# Patient Record
Sex: Female | Born: 1955
Health system: Southern US, Community
[De-identification: ages and names within clinical notes are randomized; demographics above are authoritative.]

## PROBLEM LIST (undated history)

## (undated) DIAGNOSIS — F32A Depression, unspecified: Secondary | ICD-10-CM

## (undated) DIAGNOSIS — G479 Sleep disorder, unspecified: Secondary | ICD-10-CM

## (undated) DIAGNOSIS — E785 Hyperlipidemia, unspecified: Secondary | ICD-10-CM

## (undated) DIAGNOSIS — F988 Other specified behavioral and emotional disorders with onset usually occurring in childhood and adolescence: Secondary | ICD-10-CM

## (undated) DIAGNOSIS — N200 Calculus of kidney: Secondary | ICD-10-CM

## (undated) DIAGNOSIS — E119 Type 2 diabetes mellitus without complications: Secondary | ICD-10-CM

## (undated) DIAGNOSIS — R079 Chest pain, unspecified: Secondary | ICD-10-CM

## (undated) DIAGNOSIS — C50919 Malignant neoplasm of unspecified site of unspecified female breast: Secondary | ICD-10-CM

## (undated) DIAGNOSIS — F329 Major depressive disorder, single episode, unspecified: Secondary | ICD-10-CM

## (undated) HISTORY — DX: Chest pain, unspecified: R07.9

## (undated) HISTORY — DX: Other specified behavioral and emotional disorders with onset usually occurring in childhood and adolescence: F98.8

## (undated) HISTORY — PX: OTHER SURGICAL HISTORY: SHX169

## (undated) HISTORY — DX: Major depressive disorder, single episode, unspecified: F32.9

## (undated) HISTORY — DX: Malignant neoplasm of unspecified site of unspecified female breast: C50.919

## (undated) HISTORY — DX: Sleep disorder, unspecified: G47.9

## (undated) HISTORY — DX: Type 2 diabetes mellitus without complications: E11.9

## (undated) HISTORY — DX: Depression, unspecified: F32.A

## (undated) HISTORY — DX: Calculus of kidney: N20.0

## (undated) HISTORY — DX: Hyperlipidemia, unspecified: E78.5

---
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
## 1997-08-15 HISTORY — PX: MASTECTOMY: SHX3

## 1998-05-20 ENCOUNTER — Emergency Department (HOSPITAL_COMMUNITY): Admission: EM | Admit: 1998-05-20 | Discharge: 1998-05-20 | Payer: Self-pay | Admitting: Emergency Medicine

## 1998-05-20 ENCOUNTER — Encounter: Payer: Self-pay | Admitting: Emergency Medicine

## 2003-06-19 ENCOUNTER — Other Ambulatory Visit: Admission: RE | Admit: 2003-06-19 | Discharge: 2003-06-19 | Payer: Self-pay | Admitting: *Deleted

## 2003-11-05 ENCOUNTER — Encounter: Admission: RE | Admit: 2003-11-05 | Discharge: 2003-11-05 | Payer: Self-pay | Admitting: Oncology

## 2003-12-15 ENCOUNTER — Ambulatory Visit (HOSPITAL_COMMUNITY): Admission: RE | Admit: 2003-12-15 | Discharge: 2003-12-15 | Payer: Self-pay | Admitting: Oncology

## 2004-06-14 ENCOUNTER — Encounter: Admission: RE | Admit: 2004-06-14 | Discharge: 2004-06-14 | Payer: Self-pay | Admitting: Oncology

## 2004-12-02 ENCOUNTER — Other Ambulatory Visit: Admission: RE | Admit: 2004-12-02 | Discharge: 2004-12-02 | Payer: Self-pay | Admitting: *Deleted

## 2005-01-24 ENCOUNTER — Ambulatory Visit: Payer: Self-pay | Admitting: Oncology

## 2005-04-19 ENCOUNTER — Encounter: Admission: RE | Admit: 2005-04-19 | Discharge: 2005-04-19 | Payer: Self-pay | Admitting: Oncology

## 2005-06-23 ENCOUNTER — Encounter: Payer: Self-pay | Admitting: *Deleted

## 2006-02-14 ENCOUNTER — Ambulatory Visit: Payer: Self-pay | Admitting: Oncology

## 2006-02-22 ENCOUNTER — Ambulatory Visit (HOSPITAL_COMMUNITY): Admission: RE | Admit: 2006-02-22 | Discharge: 2006-02-22 | Payer: Self-pay | Admitting: Oncology

## 2006-02-22 LAB — CBC WITH DIFFERENTIAL/PLATELET
BASO%: 0.6 % (ref 0.0–2.0)
EOS%: 1 % (ref 0.0–7.0)
HCT: 42.6 % (ref 34.8–46.6)
MCH: 30 pg (ref 26.0–34.0)
MCHC: 34.8 g/dL (ref 32.0–36.0)
MCV: 86.3 fL (ref 81.0–101.0)
NEUT#: 5.8 10*3/uL (ref 1.5–6.5)
RBC: 4.94 10*6/uL (ref 3.70–5.32)
RDW: 12.5 % (ref 11.3–14.5)
lymph#: 2.4 10*3/uL (ref 0.9–3.3)

## 2006-02-22 LAB — COMPREHENSIVE METABOLIC PANEL
AST: 25 U/L (ref 0–37)
Alkaline Phosphatase: 119 U/L — ABNORMAL HIGH (ref 39–117)
BUN: 13 mg/dL (ref 6–23)
CO2: 24 mEq/L (ref 19–32)
Chloride: 102 mEq/L (ref 96–112)
Creatinine, Ser: 0.6 mg/dL (ref 0.40–1.20)
Glucose, Bld: 85 mg/dL (ref 70–99)
Potassium: 4.4 mEq/L (ref 3.5–5.3)
Sodium: 138 mEq/L (ref 135–145)
Total Bilirubin: 0.2 mg/dL — ABNORMAL LOW (ref 0.3–1.2)

## 2006-05-25 ENCOUNTER — Encounter: Admission: RE | Admit: 2006-05-25 | Discharge: 2006-05-25 | Payer: Self-pay | Admitting: Oncology

## 2006-11-29 ENCOUNTER — Other Ambulatory Visit: Admission: RE | Admit: 2006-11-29 | Discharge: 2006-11-29 | Payer: Self-pay | Admitting: Obstetrics & Gynecology

## 2007-01-24 ENCOUNTER — Encounter (HOSPITAL_COMMUNITY): Admission: RE | Admit: 2007-01-24 | Discharge: 2007-01-25 | Payer: Self-pay | Admitting: Endocrinology

## 2007-02-21 ENCOUNTER — Ambulatory Visit: Payer: Self-pay | Admitting: Oncology

## 2007-02-23 LAB — CBC WITH DIFFERENTIAL/PLATELET
BASO%: 0.6 % (ref 0.0–2.0)
Basophils Absolute: 0 10*3/uL (ref 0.0–0.1)
EOS%: 1.5 % (ref 0.0–7.0)
Eosinophils Absolute: 0.1 10*3/uL (ref 0.0–0.5)
HCT: 44.9 % (ref 34.8–46.6)
LYMPH%: 40.6 % (ref 14.0–48.0)
MCHC: 35.3 g/dL (ref 32.0–36.0)
MONO%: 6.1 % (ref 0.0–13.0)
NEUT%: 51.2 % (ref 39.6–76.8)
RDW: 12.3 % (ref 11.3–14.5)

## 2007-02-23 LAB — COMPREHENSIVE METABOLIC PANEL
ALT: 20 U/L (ref 0–35)
Alkaline Phosphatase: 139 U/L — ABNORMAL HIGH (ref 39–117)
BUN: 10 mg/dL (ref 6–23)
CO2: 25 mEq/L (ref 19–32)
Creatinine, Ser: 0.74 mg/dL (ref 0.40–1.20)
Glucose, Bld: 83 mg/dL (ref 70–99)
Potassium: 4.6 mEq/L (ref 3.5–5.3)

## 2007-05-28 ENCOUNTER — Encounter: Admission: RE | Admit: 2007-05-28 | Discharge: 2007-05-28 | Payer: Self-pay | Admitting: Oncology

## 2008-02-27 ENCOUNTER — Ambulatory Visit: Payer: Self-pay | Admitting: Oncology

## 2008-09-08 ENCOUNTER — Encounter: Admission: RE | Admit: 2008-09-08 | Discharge: 2008-09-08 | Payer: Self-pay | Admitting: Oncology

## 2008-12-05 ENCOUNTER — Encounter: Admission: RE | Admit: 2008-12-05 | Discharge: 2008-12-05 | Payer: Self-pay | Admitting: Family Medicine

## 2008-12-08 ENCOUNTER — Encounter: Payer: Self-pay | Admitting: *Deleted

## 2009-09-28 ENCOUNTER — Encounter: Admission: RE | Admit: 2009-09-28 | Discharge: 2009-09-28 | Payer: Self-pay | Admitting: Oncology

## 2009-12-15 ENCOUNTER — Encounter (INDEPENDENT_AMBULATORY_CARE_PROVIDER_SITE_OTHER): Payer: Self-pay | Admitting: *Deleted

## 2009-12-16 ENCOUNTER — Encounter (INDEPENDENT_AMBULATORY_CARE_PROVIDER_SITE_OTHER): Payer: Self-pay | Admitting: *Deleted

## 2009-12-18 ENCOUNTER — Ambulatory Visit: Payer: Self-pay | Admitting: Internal Medicine

## 2009-12-31 ENCOUNTER — Telehealth: Payer: Self-pay | Admitting: Internal Medicine

## 2010-01-01 ENCOUNTER — Ambulatory Visit: Payer: Self-pay | Admitting: Internal Medicine

## 2010-01-04 ENCOUNTER — Encounter: Payer: Self-pay | Admitting: Internal Medicine

## 2010-09-05 ENCOUNTER — Encounter: Payer: Self-pay | Admitting: Family Medicine

## 2010-09-05 ENCOUNTER — Encounter: Payer: Self-pay | Admitting: Oncology

## 2010-09-16 NOTE — Letter (Signed)
Summary: Patient Notice- Polyp Results   Gastroenterology  [address] [place], [place] [address]   Phone: [phone]  Fax: [phone]        Jan 04, 2010 MRN: [id number]    [facility] [address] [place], [place]  [address]    Dear Ms. Gasper Lloyd,  I am pleased to inform you that the colon polyp(s) removed during your recent colonoscopy was (were) found to be benign (no cancer detected) upon pathologic examination.The polyp was tubovillous ( premalignant).  I recommend you have a repeat colonoscopy examination in 3_ years to look for recurrent polyps, as having colon polyps increases your risk for having recurrent polyps or even colon cancer in the future.  Should you develop new or worsening symptoms of abdominal pain, bowel habit changes or bleeding from the rectum or bowels, please schedule an evaluation with either your primary care physician or with me.  Additional information/recommendations:  _x_ No further action with gastroenterology is needed at this time. Please      follow-up with your primary care physician for your other healthcare      needs.  __ Please call [phone] to schedule a return visit to review your      situation.  __ Please keep your follow-up visit as already scheduled.  __ Continue treatment plan as outlined the day of your exam.  Please call us if you are having persistent problems or have questions about your condition that have not been fully answered at this time.  Sincerely,  Hart Carwin MD  This letter has been electronically signed by your physician.  Appended Document: Patient Notice- Polyp Results letter mailed

## 2010-09-16 NOTE — Letter (Signed)
Summary: Previsit letter  [facility]astroenterology  [address] [place], [place] [address]   Phone: [phone]  Fax: [phone]       12/15/2009 MRN: [id number]  [facility] [address] [place], [place]  [address]  Dear Ms. Lacey Perkins,  Welcome to the Gastroenterology Division at [facility].    You are scheduled to see a nurse for your pre-procedure visit on 12/18/2009 at 8:30AM on the 3rd floor at [facility], [address]  We ask that you try to arrive at our office 15 minutes prior to your appointment time to allow for check-in.  Your nurse visit will consist of discussing your medical and surgical history, your immediate family medical history, and your medications.    Please bring a complete list of all your medications or, if you prefer, bring the medication bottles and we will list them.  We will need to be aware of both prescribed and over the counter drugs.  We will need to know exact dosage information as well.  If you are on blood thinners (Coumadin, Plavix, Aggrenox, Ticlid, etc.) please call our office today/prior to your appointment, as we need to consult with your physician about holding your medication.   Please be prepared to read and sign documents such as consent forms, a financial agreement, and acknowledgement forms.  If necessary, and with your consent, a friend or relative is welcome to sit-in on the nurse visit with you.  Please bring your insurance card so that we may make a copy of it.  If your insurance requires a referral to see a specialist, please bring your referral form from your primary care physician.  No co-pay is required for this nurse visit.     If you cannot keep your appointment, please call [phone] to cancel or reschedule prior to your appointment date.  This allows [place] the opportunity to schedule an appointment for another patient in need of care.    Thank you for choosing [facility] for  your medical needs.  We appreciate the opportunity to care for you.  Please visit [place] at our website  to learn more about our practice.                     Sincerely.                                                                                                                   The Gastroenterology Division

## 2010-09-16 NOTE — Progress Notes (Signed)
Summary: Prep question  Phone Note Call from Patient Call back at Home Phone [phone]   Call For: Dr Juanda Chance Reason for Call: Talk to Nurse Summary of Call: Confused about what time she should start to drink her prep. Doesnt think its 5pm Initial call taken by: Leanor Kail [facility],  Dec 31, 2009 12:03 PM  Follow-up for Phone Call        Returned pts phone call and discussed starting time of prep.  Advised pt that she could start prep an hour earlier if she thought that would be easier. Explained that she needed to move everything on her instructions up an hour.   Pt. verbalized understanding.   Follow-up by: Jennye Boroughs RN,  Dec 31, 2009 1:19 PM

## 2010-09-16 NOTE — Letter (Signed)
Summary: [facility] Instructions  [facility]  [address] [place], [place] [address]   Phone: [phone]  Fax: [phone]       Lacey Perkins    08-10-1956    MRN: [id number]       Procedure Day /Date:  Friday 01/01/2010     Arrival Time:  7:30 am     Procedure Time: 8:00 am     Location of Procedure:                    _x _  [facility] (4th Floor)    PREPARATION FOR COLONOSCOPY WITH MIRALAX  Starting 5 days prior to your procedure Sunday 5/15 do not eat nuts, seeds, popcorn, corn, beans, peas,  salads, or any raw vegetables.  Do not take any fiber supplements (e.g. Metamucil, Citrucel, and Benefiber). ____________________________________________________________________________________________________   THE DAY BEFORE YOUR PROCEDURE         DATE: Thursday 5/19  1   Drink clear liquids the entire day-NO SOLID FOOD  2   Do not drink anything colored red or purple.  Avoid juices with pulp.  No orange juice.  3   Drink at least 64 oz. (8 glasses) of fluid/clear liquids during the day to prevent dehydration and help the prep work efficiently.  CLEAR LIQUIDS INCLUDE: Water Jello Ice Popsicles Tea (sugar ok, no milk/cream) Powdered fruit flavored drinks Coffee (sugar ok, no milk/cream) Gatorade Juice: apple, white grape, white cranberry  Lemonade Clear bullion, consomm, broth Carbonated beverages (any kind) Strained chicken noodle soup Hard Candy  4   Mix the entire bottle of Miralax with 64 oz. of Gatorade/Powerade in the morning and put in the refrigerator to chill.  5   At 3:00 pm take 2 Dulcolax/Bisacodyl tablets.  6   At 4:30 pm take one Reglan/Metoclopramide tablet.  7  Starting at 5:00 pm drink one 8 oz glass of the Miralax mixture every 15-20 minutes until you have finished drinking the entire 64 oz.  You should finish drinking prep around 7:30 or 8:00 pm.  8   If you are nauseated, you may take the 2nd  Reglan/Metoclopramide tablet at 6:30 pm.        9    At 8:00 pm take 2 more DULCOLAX/Bisacodyl tablets.     THE DAY OF YOUR PROCEDURE      DATE: Friday 5/20  You may drink clear liquids until 6:00 am   (2 HOURS BEFORE PROCEDURE).   MEDICATION INSTRUCTIONS  Unless otherwise instructed, you should take regular prescription medications with a small sip of water as early as possible the morning of your procedure.           OTHER INSTRUCTIONS  You will need a responsible adult at least 53 years of age to accompany you and drive you home.   This person must remain in the waiting room during your procedure.  Wear loose fitting clothing that is easily removed.  Leave jewelry and other valuables at home.  However, you may wish to bring a book to read or an iPod/MP3 player to listen to music as you wait for your procedure to start.  Remove all body piercing jewelry and leave at home.  Total time from sign-in until discharge is approximately 2-3 hours.  You should go home directly after your procedure and rest.  You can resume normal activities the day after your procedure.  The day of your procedure you should not:   Drive  Make legal decisions   Operate machinery   Drink alcohol   Return to work  You will receive specific instructions about eating, activities and medications before you leave.   The above instructions have been reviewed and explained to me by   Ezra Sites RN  Dec 18, 2009 8:38 AM    I fully understand and can verbalize these instructions _____________________________ Date _______

## 2010-09-16 NOTE — Procedures (Signed)
Summary: Colonoscopy  Patient: Lacey Perkins Note: All result statuses are Final unless otherwise noted.  Tests: (1) Colonoscopy (COL)   COL Colonoscopy           DONE     [facility]     [address]     [place], [place]  [address]           COLONOSCOPY PROCEDURE REPORT           PATIENT:  Lacey, Perkins  MR#:  [id number]     BIRTHDATE:  08-25-1955, 54 yrs. old  GENDER:  female     ENDOSCOPIST:  Hedwig Morton. Juanda Chance, MD     REF. BY:  Kathee Delton, PA     PROCEDURE DATE:  01/01/2010     PROCEDURE:  Colonoscopy [address]     ASA CLASS:  Class I     INDICATIONS:  Routine Risk Screening     MEDICATIONS:   Versed 10 mg, Fentanyl 100 mcg           DESCRIPTION OF PROCEDURE:   After the risks benefits and     alternatives of the procedure were thoroughly explained, informed     consent was obtained.  Digital rectal exam was performed and     revealed no rectal masses.   The LB CF-[id number] endoscope     was introduced through the anus and advanced to the cecum, which     was identified by both the appendix and ileocecal valve, without     limitations.  The quality of the prep was excellent, using     MiraLax.  The instrument was then slowly withdrawn as the colon     was fully examined.     <<PROCEDUREIMAGES>>           FINDINGS:  A sessile polyp was found in the cecum. large 20mm     multilobulated polyp in the cecal pouch removed piecemeal Polyp     was snared, then cauterized with monopolar cautery. Retrieval was     successful (see image1 and image2). snare polyp  This was     otherwise a normal examination of the colon (see image5, image4,     and image3).   Retroflexed views in the rectum revealed no     abnormalities.    The scope was then withdrawn from the patient     and the procedure completed.           COMPLICATIONS:  None     ENDOSCOPIC IMPRESSION:     1) Sessile polyp in the cecum     2) Otherwise normal examination     picemeal removal  of a lsrge cecal polyp     RECOMMENDATIONS:     1) Await pathology results     2) High fiber diet.     REPEAT EXAM:  In 3 year(s) for.           ______________________________     Hedwig Morton. Juanda Chance, MD           CC:           n.     eSIGNED:   Hedwig Morton. Abbegale Stehle at 01/01/2010 08:31 AM           Weldon Inches, [id number]  Note: An exclamation mark (!) indicates a result that was not dispersed into the flowsheet. Document Creation Date: 01/01/2010 1:52 PM _______________________________________________________________________  (1) Order result status: Final Collection or observation date-time: 01/01/2010 08:25  Requested date-time:  Receipt date-time:  Reported date-time:  Referring Physician:   Ordering Physician: Lina Sar [phone]) Specimen Source:  Source: Launa Grill Order Number: [id number] Lab site:   Appended Document: Colonoscopy     Procedures Next Due Date:    Colonoscopy: 01/2013

## 2010-09-16 NOTE — Miscellaneous (Signed)
Summary: [facility]  Clinical Lists Changes  Medications: Added new medication of MIRALAX   POWD (POLYETHYLENE GLYCOL 3350) As per prep  instructions. - Signed Added new medication of REGLAN 10 MG  TABS (METOCLOPRAMIDE HCL) As per prep instructions. - Signed Added new medication of DULCOLAX 5 MG  TBEC (BISACODYL) Day before procedure take 2 at 3pm and 2 at 8pm. - Signed Rx of MIRALAX   POWD (POLYETHYLENE GLYCOL 3350) As per prep  instructions.;  #255gm x 0;  Signed;  Entered by: Ezra Sites RN;  Authorized by: Hart Carwin MD;  Method used: Electronically to [facility]. #[id number]*, [address], [place], [place], [place]  [address], Ph: [phone], Fax: [phone] Rx of REGLAN 10 MG  TABS (METOCLOPRAMIDE HCL) As per prep instructions.;  #2 x 0;  Signed;  Entered by: Ezra Sites RN;  Authorized by: Hart Carwin MD;  Method used: Electronically to [facility]. #[id number]*, [address], [place], [place], [place]  [address], Ph: [phone], Fax: [phone] Rx of DULCOLAX 5 MG  TBEC (BISACODYL) Day before procedure take 2 at 3pm and 2 at 8pm.;  #4 x 0;  Signed;  Entered by: Ezra Sites RN;  Authorized by: Hart Carwin MD;  Method used: Electronically to [facility]. #[id number]*, [address] [place], [place], [place]  [address], Ph: [phone], Fax: [phone] Allergies: Added new allergy or adverse reaction of SULFA Added new allergy or adverse reaction of PCN Added new allergy or adverse reaction of MORPHINE Observations: Added new observation of NKA: F (12/18/2009 8:12)    Prescriptions: DULCOLAX 5 MG  TBEC (BISACODYL) Day before procedure take 2 at 3pm and 2 at 8pm.  #4 x 0   Entered by:   Ezra Sites RN   Authorized by:   Hart Carwin MD   Signed by:   Ezra Sites RN on 12/18/2009   Method used:   Electronically to        [facility]. [phone]* (retail)       [phone]       [place], [place]  [address]       Ph: [phone]       Fax: [phone]   RxID:   [id number] REGLAN 10 MG  TABS (METOCLOPRAMIDE HCL) As per prep instructions.  #2 x 0   Entered by:   Ezra Sites RN   Authorized by:   Hart Carwin MD   Signed by:   Ezra Sites RN on 12/18/2009   Method used:   Electronically to        [facility]. [phone]* (retail)       [phone] [address].       [place], [place]  [address]       Ph: [phone]       Fax: [phone]   RxID:   [id number] MIRALAX   POWD (POLYETHYLENE GLYCOL 3350) As per prep  instructions.  #255gm x 0   Entered by:   Ezra Sites RN   Authorized by:   Hart Carwin MD   Signed by:   Ezra Sites RN on 12/18/2009   Method used:   Electronically to        [facility]. [phone]* (retail)       [phone] [address].  [place], [place]  [address]       Ph: [phone]       Fax: [phone]   RxID:   [id number]

## 2012-01-24 ENCOUNTER — Other Ambulatory Visit: Payer: Self-pay | Admitting: Family Medicine

## 2012-01-24 DIAGNOSIS — Z1231 Encounter for screening mammogram for malignant neoplasm of breast: Secondary | ICD-10-CM

## 2012-01-25 ENCOUNTER — Ambulatory Visit
Admission: RE | Admit: 2012-01-25 | Discharge: 2012-01-25 | Disposition: A | Discharge status: Home / Self Care | Payer: Managed Care, Other (non HMO) | Source: Ambulatory Visit | Attending: Family Medicine | Admitting: Family Medicine

## 2012-01-25 DIAGNOSIS — Z1231 Encounter for screening mammogram for malignant neoplasm of breast: Secondary | ICD-10-CM

## 2012-01-27 ENCOUNTER — Encounter: Payer: Self-pay | Admitting: Cardiovascular Disease

## 2012-01-27 ENCOUNTER — Encounter: Payer: Self-pay | Admitting: *Deleted

## 2012-12-05 ENCOUNTER — Encounter: Payer: Self-pay | Admitting: Internal Medicine

## 2012-12-10 ENCOUNTER — Other Ambulatory Visit (HOSPITAL_COMMUNITY): Payer: Self-pay | Admitting: Internal Medicine

## 2012-12-10 DIAGNOSIS — R079 Chest pain, unspecified: Secondary | ICD-10-CM

## 2012-12-14 ENCOUNTER — Ambulatory Visit (HOSPITAL_COMMUNITY)
Admission: RE | Admit: 2012-12-14 | Discharge: 2012-12-14 | Disposition: A | Discharge status: Home / Self Care | Payer: Managed Care, Other (non HMO) | Source: Ambulatory Visit | Attending: Cardiovascular Disease | Admitting: Cardiovascular Disease

## 2012-12-14 DIAGNOSIS — R079 Chest pain, unspecified: Secondary | ICD-10-CM

## 2012-12-14 MED ORDER — TECHNETIUM TC 99M SESTAMIBI GENERIC - CARDIOLITE
29.6000 | Freq: Once | INTRAVENOUS | Status: AC | PRN
  Administered 2012-12-14: 30 via INTRAVENOUS

## 2012-12-14 MED ORDER — TECHNETIUM TC 99M SESTAMIBI GENERIC - CARDIOLITE
10.1000 | Freq: Once | INTRAVENOUS | Status: AC | PRN
  Administered 2012-12-14: 10.1 via INTRAVENOUS

## 2012-12-14 NOTE — Procedures (Addendum)
[facility] [address] [place] [address] [place] [address] [phone]  Cardiology Nuclear Med Study  Lacey Perkins is a 57 y.o. female     MRN : [id number]     DOB: Apr 08, 1956  Procedure Date: 12/14/2012  Nuclear Med Background Indication for Stress Test:  Evaluation for Ischemia History:  PT DENIES PRIOR CARDIAC AND RESPIRATORY HISTORY. Cardiac Risk Factors: Family History - CAD, Lipids, NIDDM, Overweight and Smoker  Symptoms:  Chest Pain, Dizziness, DOE, Fatigue, Light-Headedness, Palpitations and SOB   Nuclear Pre-Procedure Caffeine/Decaff Intake:  10:00pm NPO After: 8:00am   IV Site: R Antecubital  IV 0.9% NS with Angio Cath:  22g  Chest Size (in):  N/A IV Started by: Emmit Pomfret, RN  Height: 5\' 3"  (1.6 m)  Cup Size: D  BMI:  Body mass index is 28.88 kg/(m^2). Weight:  163 lb (73.936 kg)   Tech Comments:  N/A    Nuclear Med Study 1 or 2 day study: 1 day  Stress Test Type:  Stress  Order Authorizing Provider:  Zoila Shutter, MD   Resting Radionuclide: Technetium 5m Sestamibi  Resting Radionuclide Dose: 10.1 mCi   Stress Radionuclide:  Technetium 64m Sestamibi  Stress Radionuclide Dose: 29.6 mCi           Stress Protocol Rest HR: 96 Stress HR: 151  Rest BP: 140/91 Stress BP: 178/84  Exercise Time (min): 8:00 METS: 10.1   Predicted Max HR: 165 bpm % Max HR: 91.52 bpm Rate Pressure Product: 40102  Dose of Adenosine (mg):  n/a Dose of Lexiscan: n/a mg  Dose of Atropine (mg): n/a Dose of Dobutamine: n/a mcg/kg/min (at max HR)  Stress Test Technologist: Esperanza Sheets, CCT Nuclear Technologist: Gonzella Lex, CNMT   Rest Procedure:  Myocardial perfusion imaging was performed at rest 45 minutes following the intravenous administration of Technetium 29m Sestamibi. Stress Procedure:  The patient performed treadmill exercise using a Bruce  Protocol for 8:00 minutes. The patient stopped due to SOB and denied any  chest pain.  There were no significant ST-T wave changes.  Technetium 52m Sestamibi was injected at peak exercise and myocardial perfusion imaging was performed after a brief delay.  Transient Ischemic Dilatation (Normal <1.22):  0.91 Lung/Heart Ratio (Normal <0.45):  0.38 QGS EDV:  43 ml QGS ESV:  12 ml LV Ejection Fraction: 73%     Rest ECG: NSR - Normal EKG  Stress ECG: No significant change from baseline ECG  QPS Raw Data Images:  Normal; no motion artifact; normal heart/lung ratio. Stress Images:  Normal homogeneous uptake in all areas of the myocardium. Rest Images:  Comparison with the stress images reveals no significant change. Subtraction (SDS):  No evidence of ischemia.  Impression Exercise Capacity:  Good exercise capacity. BP Response:  Hypertensive blood pressure response. Clinical Symptoms:  No significant symptoms noted. ECG Impression:  No significant ST segment change suggestive of ischemia. Comparison with Prior Nuclear Study: No significant change from previous study  Overall Impression:  Normal stress nuclear study.  LV Wall Motion:  NL LV Function; NL Wall Motion   Damarion Mendizabal, MD  12/14/2012 4:30 PM

## 2013-02-08 ENCOUNTER — Other Ambulatory Visit: Payer: Self-pay | Admitting: Family Medicine

## 2013-02-08 DIAGNOSIS — Z1231 Encounter for screening mammogram for malignant neoplasm of breast: Secondary | ICD-10-CM

## 2013-03-07 ENCOUNTER — Ambulatory Visit: Payer: Managed Care, Other (non HMO)

## 2013-03-11 ENCOUNTER — Ambulatory Visit
Admission: RE | Admit: 2013-03-11 | Discharge: 2013-03-11 | Disposition: A | Discharge status: Home / Self Care | Payer: Managed Care, Other (non HMO) | Source: Ambulatory Visit | Attending: Family Medicine | Admitting: Family Medicine

## 2013-03-11 DIAGNOSIS — Z1231 Encounter for screening mammogram for malignant neoplasm of breast: Secondary | ICD-10-CM

## 2013-10-19 ENCOUNTER — Encounter (HOSPITAL_COMMUNITY): Payer: Self-pay | Admitting: Emergency Medicine

## 2013-10-19 ENCOUNTER — Emergency Department (HOSPITAL_COMMUNITY)
Admission: EM | Admit: 2013-10-19 | Discharge: 2013-10-20 | Disposition: A | Discharge status: Home / Self Care | Payer: Managed Care, Other (non HMO) | Attending: Emergency Medicine | Admitting: Emergency Medicine

## 2013-10-19 ENCOUNTER — Emergency Department (HOSPITAL_COMMUNITY): Payer: Managed Care, Other (non HMO)

## 2013-10-19 DIAGNOSIS — F3289 Other specified depressive episodes: Secondary | ICD-10-CM | POA: Insufficient documentation

## 2013-10-19 DIAGNOSIS — R109 Unspecified abdominal pain: Secondary | ICD-10-CM | POA: Insufficient documentation

## 2013-10-19 DIAGNOSIS — R112 Nausea with vomiting, unspecified: Secondary | ICD-10-CM

## 2013-10-19 DIAGNOSIS — F329 Major depressive disorder, single episode, unspecified: Secondary | ICD-10-CM | POA: Insufficient documentation

## 2013-10-19 DIAGNOSIS — F988 Other specified behavioral and emotional disorders with onset usually occurring in childhood and adolescence: Secondary | ICD-10-CM | POA: Insufficient documentation

## 2013-10-19 DIAGNOSIS — Z87442 Personal history of urinary calculi: Secondary | ICD-10-CM | POA: Insufficient documentation

## 2013-10-19 DIAGNOSIS — Z79899 Other long term (current) drug therapy: Secondary | ICD-10-CM | POA: Insufficient documentation

## 2013-10-19 DIAGNOSIS — R339 Retention of urine, unspecified: Secondary | ICD-10-CM | POA: Insufficient documentation

## 2013-10-19 DIAGNOSIS — R42 Dizziness and giddiness: Secondary | ICD-10-CM | POA: Insufficient documentation

## 2013-10-19 DIAGNOSIS — IMO0002 Reserved for concepts with insufficient information to code with codable children: Secondary | ICD-10-CM | POA: Insufficient documentation

## 2013-10-19 DIAGNOSIS — G479 Sleep disorder, unspecified: Secondary | ICD-10-CM | POA: Insufficient documentation

## 2013-10-19 DIAGNOSIS — F17200 Nicotine dependence, unspecified, uncomplicated: Secondary | ICD-10-CM | POA: Insufficient documentation

## 2013-10-19 DIAGNOSIS — Z880 Allergy status to penicillin: Secondary | ICD-10-CM | POA: Insufficient documentation

## 2013-10-19 DIAGNOSIS — Z853 Personal history of malignant neoplasm of breast: Secondary | ICD-10-CM | POA: Insufficient documentation

## 2013-10-19 LAB — COMPREHENSIVE METABOLIC PANEL
ALBUMIN: 4.3 g/dL (ref 3.5–5.2)
ALT: 39 U/L — ABNORMAL HIGH (ref 0–35)
AST: 26 U/L (ref 0–37)
Alkaline Phosphatase: 139 U/L — ABNORMAL HIGH (ref 39–117)
BILIRUBIN TOTAL: 0.4 mg/dL (ref 0.3–1.2)
BUN: 14 mg/dL (ref 6–23)
CALCIUM: 9.9 mg/dL (ref 8.4–10.5)
CHLORIDE: 97 meq/L (ref 96–112)
CO2: 23 mEq/L (ref 19–32)
CREATININE: 0.4 mg/dL — AB (ref 0.50–1.10)
GFR calc Af Amer: >90 mL/min (ref >90)
GFR calc non Af Amer: >90 mL/min (ref >90)
Glucose, Bld: 153 mg/dL — ABNORMAL HIGH (ref 70–99)
Potassium: 4.3 mEq/L (ref 3.7–5.3)
Sodium: 138 mEq/L (ref 137–147)
Total Protein: 7.8 g/dL (ref 6.0–8.3)

## 2013-10-19 LAB — CBC WITH DIFFERENTIAL/PLATELET
BASOS PCT: 0 % (ref 0–1)
Basophils Absolute: 0 10*3/uL (ref 0.0–0.1)
Eosinophils Absolute: 0 10*3/uL (ref 0.0–0.7)
Eosinophils Relative: 0 % (ref 0–5)
HCT: 47.3 % — ABNORMAL HIGH (ref 36.0–46.0)
Hemoglobin: 16.1 g/dL — ABNORMAL HIGH (ref 12.0–15.0)
Lymphocytes Relative: 19 % (ref 12–46)
Lymphs Abs: 2.3 10*3/uL (ref 0.7–4.0)
MCH: 28.6 pg (ref 26.0–34.0)
MCHC: 34 g/dL (ref 30.0–36.0)
MCV: 84 fL (ref 78.0–100.0)
MONO ABS: 0.5 10*3/uL (ref 0.1–1.0)
Monocytes Relative: 4 % (ref 3–12)
Neutro Abs: 9.7 10*3/uL — ABNORMAL HIGH (ref 1.7–7.7)
Neutrophils Relative %: 78 % — ABNORMAL HIGH (ref 43–77)
Platelets: 370 10*3/uL (ref 150–400)
RBC: 5.63 MIL/uL — AB (ref 3.87–5.11)
RDW: 13.1 % (ref 11.5–15.5)
WBC: 12.5 10*3/uL — ABNORMAL HIGH (ref 4.0–10.5)

## 2013-10-19 LAB — LIPASE, BLOOD: Lipase: 16 U/L (ref 11–59)

## 2013-10-19 LAB — URINALYSIS, ROUTINE W REFLEX MICROSCOPIC
Bilirubin Urine: NEGATIVE
Glucose, UA: >1000 mg/dL — AB
Hgb urine dipstick: NEGATIVE
Ketones, ur: >80 mg/dL — AB
Leukocytes, UA: NEGATIVE
Nitrite: NEGATIVE
Protein, ur: NEGATIVE mg/dL
Specific Gravity, Urine: 1.037 — ABNORMAL HIGH (ref 1.005–1.030)
Urobilinogen, UA: 0.2 mg/dL (ref 0.0–1.0)
pH: 5.5 (ref 5.0–8.0)

## 2013-10-19 LAB — URINE MICROSCOPIC-ADD ON

## 2013-10-19 LAB — I-STAT CG4 LACTIC ACID, ED: Lactic Acid, Venous: 0.82 mmol/L (ref 0.5–2.2)

## 2013-10-19 MED ORDER — ONDANSETRON HCL 4 MG/2ML IJ SOLN
4.0000 mg | Freq: Once | INTRAMUSCULAR | Status: AC
  Administered 2013-10-19: 4 mg via INTRAVENOUS
  Filled 2013-10-19: qty 2

## 2013-10-19 MED ORDER — IOHEXOL 300 MG/ML  SOLN
50.0000 mL | Freq: Once | INTRAMUSCULAR | Status: AC | PRN
  Administered 2013-10-19: 50 mL via ORAL

## 2013-10-19 MED ORDER — PROMETHAZINE HCL 25 MG/ML IJ SOLN
12.5000 mg | Freq: Once | INTRAMUSCULAR | Status: AC
  Administered 2013-10-20: 12.5 mg via INTRAVENOUS
  Filled 2013-10-19: qty 1

## 2013-10-19 MED ORDER — IOHEXOL 300 MG/ML  SOLN
100.0000 mL | Freq: Once | INTRAMUSCULAR | Status: AC | PRN
  Administered 2013-10-19: 100 mL via INTRAVENOUS

## 2013-10-19 MED ORDER — SODIUM CHLORIDE 0.9 % IV BOLUS (SEPSIS)
1000.0000 mL | Freq: Once | INTRAVENOUS | Status: AC
  Administered 2013-10-19: 1000 mL via INTRAVENOUS

## 2013-10-19 MED ORDER — KETOROLAC TROMETHAMINE 15 MG/ML IJ SOLN
30.0000 mg | Freq: Once | INTRAMUSCULAR | Status: AC
  Administered 2013-10-19: 30 mg via INTRAVENOUS
  Filled 2013-10-19: qty 2

## 2013-10-19 MED ORDER — KETOROLAC TROMETHAMINE 15 MG/ML IJ SOLN
30.0000 mg | Freq: Once | INTRAMUSCULAR | Status: AC
  Administered 2013-10-20: 30 mg via INTRAVENOUS
  Filled 2013-10-19: qty 2

## 2013-10-19 MED ORDER — ONDANSETRON HCL 4 MG/2ML IJ SOLN: 4.0000 mg | Freq: Once | INTRAMUSCULAR | Status: DC

## 2013-10-19 NOTE — ED Notes (Signed)
Pt sent from [facility], for N/V, dizziness and urinary retention. Pt states that she has not urinated since sometime this am. Pt also c/o N/V, inability to keep fluids/food down. 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 Pt is A&O and in NAD

## 2013-10-19 NOTE — ED Provider Notes (Signed)
CSN: [id number]     Arrival date & time 10/19/13  1832 History   First MD Initiated Contact with Patient 10/19/13 1843     Chief Complaint  Patient presents with  . Nausea  . Emesis  . Dizziness  . Urinary Retention     (Consider location/radiation/quality/duration/timing/severity/associated sxs/prior Treatment) HPI Patient presents to the emergency department with nausea, vomiting, last 12 hours.  Patient, states, that she's also had some mild dizziness, and decreased urination.  Patient, states, that she's not had any chest pain, shortness of breath, weakness, headache, blurred vision, back pain, rash, fever, or syncope.  The patient, states, that she is unable to keep any fluids down.  Patient, states, that her blood sugars have been running around normal levels even though she is vomiting.  Patient did not take any medications for her symptoms prior to route   Past Medical History  Diagnosis Date  . Chest pain   . ADD (attention deficit disorder)   . Sleep disturbance   . Breast cancer   . Kidney stone   . Depression    Past Surgical History  Procedure Laterality Date  . Mastectomy    . Tonsillectomy     Family History  Problem Relation Age of Onset  . Hypertension Mother   . Diabetes Mother   . Hypertension Father    History  Substance Use Topics  . Smoking status: Current Every Day Smoker -- 1.00 packs/day    Types: Cigarettes  . Smokeless tobacco: Not on file  . Alcohol Use: Yes     Comment: socially   OB History   Grav Para Term Preterm Abortions TAB SAB Ect Mult Living                 Review of Systems  All other systems negative except as documented in the HPI. All pertinent positives and negatives as reviewed in the HPI.  Allergies  Morphine; Penicillins; and Sulfonamide derivatives  Home Medications   Current Outpatient Rx  Name  Route  Sig  Dispense  Refill  . amphetamine-dextroamphetamine (ADDERALL XR) 25 MG 24 hr capsule   Oral   Take 50 mg  by mouth every morning.         Marland Kitchen buPROPion (WELLBUTRIN XL) 300 MG 24 hr tablet   Oral   Take 300 mg by mouth every evening.          . Canagliflozin 100 MG TABS   Oral   Take 1 tablet by mouth every morning.         . clonazePAM (KLONOPIN) 0.5 MG tablet   Oral   Take 0.5 mg by mouth 2 (two) times daily as needed for anxiety.         Marland Kitchen desvenlafaxine (PRISTIQ) 50 MG 24 hr tablet   Oral   Take 50 mg by mouth every evening.          . Eszopiclone 3 MG TABS   Oral   Take 3 mg by mouth at bedtime. Take immediately before bedtime         . fluticasone (FLONASE) 50 MCG/ACT nasal spray   Nasal   Place 2 sprays into the nose every morning.          . metFORMIN (GLUCOPHAGE) 500 MG tablet   Oral   Take 1,000 mg by mouth 2 (two) times daily with a meal.          BP 147/75  Pulse 76  Temp(Src) 98.2 F (  36.8 C) (Oral)  Resp 16  SpO2 94% Physical Exam  Nursing note and vitals reviewed. Constitutional: She is oriented to person, place, and time. She appears well-developed and well-nourished. No distress.  HENT:  Head: Normocephalic and atraumatic.  Mouth/Throat: Oropharynx is clear and moist.  Eyes: Pupils are equal, round, and reactive to light.  Neck: Normal range of motion. Neck supple.  Cardiovascular: Normal rate, regular rhythm and normal heart sounds.  Exam reveals no gallop and no friction rub.   No murmur heard. Pulmonary/Chest: Effort normal and breath sounds normal. No respiratory distress.  Abdominal: Soft. Bowel sounds are normal. She exhibits no distension. There is tenderness. There is no rebound and no guarding.  Neurological: She is alert and oriented to person, place, and time. She exhibits normal muscle tone. Coordination normal.  Skin: Skin is warm and dry. No rash noted. No erythema.    ED Course  Procedures (including critical care time) Labs Review Labs Reviewed  CBC WITH DIFFERENTIAL - Abnormal; Notable for the following:    WBC 12.5  (*)    RBC 5.63 (*)    Hemoglobin 16.1 (*)    HCT 47.3 (*)    Neutrophils Relative % 78 (*)    Neutro Abs 9.7 (*)    All other components within normal limits  COMPREHENSIVE METABOLIC PANEL - Abnormal; Notable for the following:    Glucose, Bld 153 (*)    Creatinine, Ser 0.40 (*)    ALT 39 (*)    Alkaline Phosphatase 139 (*)    All other components within normal limits  LIPASE, BLOOD  URINALYSIS, ROUTINE W REFLEX MICROSCOPIC   Patient is feeling some better, but still said she has some dizziness.  The patient was in the midst of getting her liter bolus.  Advised the patient that we will finish the liter of fluid and give her some medications for her symptoms.  Patient is not orthostatic.   Ignacia Felling PA-C will take over the patients care and will follow up with her.  Brent General, PA-C 10/21/13 [phone]

## 2013-10-19 NOTE — ED Notes (Signed)
Pt is unable to urinate at this time. 

## 2013-10-20 MED ORDER — ONDANSETRON HCL 4 MG PO TABS: 4.0000 mg | ORAL_TABLET | Freq: Four times a day (QID) | ORAL | Status: DC

## 2013-10-20 MED ORDER — SODIUM CHLORIDE 0.9 % IV BOLUS (SEPSIS)
1000.0000 mL | Freq: Once | INTRAVENOUS | Status: AC
  Administered 2013-10-20: 1000 mL via INTRAVENOUS

## 2013-10-20 MED ORDER — DIAZEPAM 5 MG PO TABS: 5.0000 mg | ORAL_TABLET | Freq: Two times a day (BID) | ORAL | Status: DC

## 2013-10-20 MED ORDER — MECLIZINE HCL 25 MG PO TABS
25.0000 mg | ORAL_TABLET | Freq: Once | ORAL | Status: AC
  Administered 2013-10-20: 25 mg via ORAL
  Filled 2013-10-20: qty 1

## 2013-10-20 NOTE — ED Provider Notes (Signed)
Results for orders placed during the hospital encounter of 10/19/13  CBC WITH DIFFERENTIAL      Result Value Ref Range   WBC 12.5 (*) 4.0 - 10.5 K/uL   RBC 5.63 (*) 3.87 - 5.11 MIL/uL   Hemoglobin 16.1 (*) 12.0 - 15.0 g/dL   HCT 47.3 (*) 36.0 - 46.0 %   MCV 84.0  78.0 - 100.0 fL   MCH 28.6  26.0 - 34.0 pg   MCHC 34.0  30.0 - 36.0 g/dL   RDW 13.1  11.5 - 15.5 %   Platelets 370  150 - 400 K/uL   Neutrophils Relative % 78 (*) 43 - 77 %   Neutro Abs 9.7 (*) 1.7 - 7.7 K/uL   Lymphocytes Relative 19  12 - 46 %   Lymphs Abs 2.3  0.7 - 4.0 K/uL   Monocytes Relative 4  3 - 12 %   Monocytes Absolute 0.5  0.1 - 1.0 K/uL   Eosinophils Relative 0  0 - 5 %   Eosinophils Absolute 0.0  0.0 - 0.7 K/uL   Basophils Relative 0  0 - 1 %   Basophils Absolute 0.0  0.0 - 0.1 K/uL  COMPREHENSIVE METABOLIC PANEL      Result Value Ref Range   Sodium 138  137 - 147 mEq/L   Potassium 4.3  3.7 - 5.3 mEq/L   Chloride 97  96 - 112 mEq/L   CO2 23  19 - 32 mEq/L   Glucose, Bld 153 (*) 70 - 99 mg/dL   BUN 14  6 - 23 mg/dL   Creatinine, Ser 0.40 (*) 0.50 - 1.10 mg/dL   Calcium 9.9  8.4 - 10.5 mg/dL   Total Protein 7.8  6.0 - 8.3 g/dL   Albumin 4.3  3.5 - 5.2 g/dL   AST 26  0 - 37 U/L   ALT 39 (*) 0 - 35 U/L   Alkaline Phosphatase 139 (*) 39 - 117 U/L   Total Bilirubin 0.4  0.3 - 1.2 mg/dL   GFR calc non Af Amer >90  >90 mL/min   GFR calc Af Amer >90  >90 mL/min  LIPASE, BLOOD      Result Value Ref Range   Lipase 16  11 - 59 U/L  URINALYSIS, ROUTINE W REFLEX MICROSCOPIC      Result Value Ref Range   Color, Urine YELLOW  YELLOW   APPearance CLEAR  CLEAR   Specific Gravity, Urine 1.037 (*) 1.005 - 1.030   pH 5.5  5.0 - 8.0   Glucose, UA >1000 (*) NEGATIVE mg/dL   Hgb urine dipstick NEGATIVE  NEGATIVE   Bilirubin Urine NEGATIVE  NEGATIVE   Ketones, ur >80 (*) NEGATIVE mg/dL   Protein, ur NEGATIVE  NEGATIVE mg/dL   Urobilinogen, UA 0.2  0.0 - 1.0 mg/dL   Nitrite NEGATIVE  NEGATIVE   Leukocytes, UA  NEGATIVE  NEGATIVE  URINE MICROSCOPIC-ADD ON      Result Value Ref Range   Squamous Epithelial / LPF FEW (*) RARE   WBC, UA 0-2  <3 WBC/hpf   RBC / HPF 0-2  <3 RBC/hpf   Bacteria, UA RARE  RARE  I-STAT CG4 LACTIC ACID, ED      Result Value Ref Range   Lactic Acid, Venous 0.82  0.5 - 2.2 mmol/L   Ct Abdomen Pelvis W Contrast  10/19/2013   CLINICAL DATA:  Nausea, vomiting.  Urinary retention.  EXAM: CT ABDOMEN AND PELVIS WITH CONTRAST  TECHNIQUE: Multidetector CT imaging of the abdomen and pelvis was performed using the standard protocol following bolus administration of intravenous contrast.  CONTRAST:  26mL OMNIPAQUE IOHEXOL 300 MG/ML SOLN, 155mL OMNIPAQUE IOHEXOL 300 MG/ML SOLN  COMPARISON:  Prior CT from 12/15/2003.  FINDINGS: Mild subsegmental atelectasis seen dependently within the lower lobes bilaterally. Atelectatic changes noted within the lingula. Bilateral breast implants partially visualized.  Diffuse hypoattenuation of the liver is compatible with steatosis. Subcentimeter hypodense lesions within the left and right hepatic are too small the characterize by CT, but statistically likely represent cysts. Focal fat sparing noted at the hepatic dome and adjacent to the ligamentum teres.  Gallbladder is within normal limits. No biliary ductal dilatation. Spleen, adrenal glands, and pancreas demonstrate a normal contrast enhanced appearance. Kidneys are equal in size with symmetric enhancement. No nephrolithiasis, hydronephrosis, or focal enhancing renal mass.  No evidence of bowel obstruction. Appendix is well visualized in the right lower quadrant and is of normal caliber and appearance without associated inflammatory changes to suggest acute appendicitis. No abnormal wall thickening, mucosal enhancement, or inflammatory fat stranding seen about the bowels.  Bladder is within normal limits without evidence of marked of distension. No abnormal bladder wall thickening or inflammatory changes. Uterus  and ovaries are normal.  No free air or fluid. No enlarged intra-abdominal pelvic lymph nodes.  No acute osseous abnormality. No focal lytic or blastic osseous lesions.  IMPRESSION: 1. No CT evidence of acute intra-abdominal or pelvic process. 2. Hepatic steatosis with probable tiny hepatic cysts, similar to prior.   Electronically Signed   By: Jeannine Boga M.D.   On: 10/19/2013 22:21    6:50 AM Care of the patient has been taken over from C. Lawyer, the patient was allowed to stay in the ED all night because she felt like she could not go home yet.  She continues to complain of dizziness but is able to ambulate.  When pressed she states that she does feel better than when she initially came in.  There is no other emergency medical condition at this time.    Idalia Needle Joelyn Oms, [place] 10/20/13 [phone]

## 2013-10-20 NOTE — ED Notes (Addendum)
Pt ambulated to the bathroom.  Pt was had a staggering gate due to her refusal to open her eyes.  Pt still complaining of dizziness.

## 2013-10-20 NOTE — Discharge Instructions (Signed)
Benign Positional Vertigo Vertigo means you feel like you or your surroundings are moving when they are not. Benign positional vertigo is the most common form of vertigo. Benign means that the cause of your condition is not serious. Benign positional vertigo is more common in older adults. CAUSES  Benign positional vertigo is the result of an upset in the labyrinth system. This is an area in the middle ear that helps control your balance. This may be caused by a viral infection, head injury, or repetitive motion. However, often no specific cause is found. SYMPTOMS  Symptoms of benign positional vertigo occur when you move your head or eyes in different directions. Some of the symptoms may include:  Loss of balance and falls.  Vomiting.  Blurred vision.  Dizziness.  Nausea.  Involuntary eye movements (nystagmus). DIAGNOSIS  Benign positional vertigo is usually diagnosed by physical exam. If the specific cause of your benign positional vertigo is unknown, your caregiver may perform imaging tests, such as magnetic resonance imaging (MRI) or computed tomography (CT). TREATMENT  Your caregiver may recommend movements or procedures to correct the benign positional vertigo. Medicines such as meclizine, benzodiazepines, and medicines for nausea may be used to treat your symptoms. In rare cases, if your symptoms are caused by certain conditions that affect the inner ear, you may need surgery. HOME CARE INSTRUCTIONS   Follow your caregiver's instructions.  Move slowly. Do not make sudden body or head movements.  Avoid driving.  Avoid operating heavy machinery.  Avoid performing any tasks that would be dangerous to you or others during a vertigo episode.  Drink enough fluids to keep your urine clear or pale yellow. SEEK IMMEDIATE MEDICAL CARE IF:   You develop problems with walking, weakness, numbness, or using your arms, hands, or legs.  You have difficulty speaking.  You develop  severe headaches.  Your nausea or vomiting continues or gets worse.  You develop visual changes.  Your family or friends notice any behavioral changes.  Your condition gets worse.  You have a fever.  You develop a stiff neck or sensitivity to light. MAKE SURE YOU:   Understand these instructions.  Will watch your condition.  Will get help right away if you are not doing well or get worse. Document Released: 05/09/2006 Document Revised: 10/24/2011 Document Reviewed: 04/21/2011 [facility] Patient Information 2014 [place].  Dizziness Dizziness is a common problem. It is a feeling of unsteadiness or lightheadedness. You may feel like you are about to faint. Dizziness can lead to injury if you stumble or fall. A person of any age group can suffer from dizziness, but dizziness is more common in older adults. CAUSES  Dizziness can be caused by many different things, including:  Middle ear problems.  Standing for too long.  Infections.  An allergic reaction.  Aging.  An emotional response to something, such as the sight of blood.  Side effects of medicines.  Fatigue.  Problems with circulation or blood pressure.  Excess use of alcohol, medicines, or illegal drug use.  Breathing too fast (hyperventilation).  An arrhythmia or problems with your heart rhythm.  Low red blood cell count (anemia).  Pregnancy.  Vomiting, diarrhea, fever, or other illnesses that cause dehydration.  Diseases or conditions such as Parkinson's disease, high blood pressure (hypertension), diabetes, and thyroid problems.  Exposure to extreme heat. DIAGNOSIS  To find the cause of your dizziness, your caregiver may do a physical exam, lab tests, radiologic imaging scans, or an electrocardiography test (ECG).  TREATMENT  Treatment of dizziness depends on the cause of your symptoms and can vary greatly. HOME CARE INSTRUCTIONS   Drink enough fluids to keep your urine clear or pale  yellow. This is especially important in very hot weather. In the elderly, it is also important in cold weather.  If your dizziness is caused by medicines, take them exactly as directed. When taking blood pressure medicines, it is especially important to get up slowly.  Rise slowly from chairs and steady yourself until you feel okay.  In the morning, first sit up on the side of the bed. When this seems okay, stand slowly while holding onto something until you know your balance is fine.  If you need to stand in one place for a long time, be sure to move your legs often. Tighten and relax the muscles in your legs while standing.  If dizziness continues to be a problem, have someone stay with you for a day or two. Do this until you feel you are well enough to stay alone. Have the person call your caregiver if he or she notices changes in you that are concerning.  Do not drive or use heavy machinery if you feel dizzy.  Do not drink alcohol. SEEK IMMEDIATE MEDICAL CARE IF:   Your dizziness or lightheadedness gets worse.  You feel nauseous or vomit.  You develop problems with talking, walking, weakness, or using your arms, hands, or legs.  You are not thinking clearly or you have difficulty forming sentences. It may take a friend or family member to determine if your thinking is normal.  You develop chest pain, abdominal pain, shortness of breath, or sweating.  Your vision changes.  You notice any bleeding.  You have side effects from medicine that seems to be getting worse rather than better. MAKE SURE YOU:   Understand these instructions.  Will watch your condition.  Will get help right away if you are not doing well or get worse. Document Released: 01/25/2001 Document Revised: 10/24/2011 Document Reviewed: 02/18/2011 [facility] Patient Information 2014 [place], [place].  Nausea and Vomiting Nausea is a sick feeling that often comes before throwing up (vomiting). Vomiting is a  reflex where stomach contents come out of your mouth. Vomiting can cause severe loss of body fluids (dehydration). Children and elderly adults can become dehydrated quickly, especially if they also have diarrhea. Nausea and vomiting are symptoms of a condition or disease. It is important to find the cause of your symptoms. CAUSES   Direct irritation of the stomach lining. This irritation can result from increased acid production (gastroesophageal reflux disease), infection, food poisoning, taking certain medicines (such as nonsteroidal anti-inflammatory drugs), alcohol use, or tobacco use.  Signals from the brain.These signals could be caused by a headache, heat exposure, an inner ear disturbance, increased pressure in the brain from injury, infection, a tumor, or a concussion, pain, emotional stimulus, or metabolic problems.  An obstruction in the gastrointestinal tract (bowel obstruction).  Illnesses such as diabetes, hepatitis, gallbladder problems, appendicitis, kidney problems, cancer, sepsis, atypical symptoms of a heart attack, or eating disorders.  Medical treatments such as chemotherapy and radiation.  Receiving medicine that makes you sleep (general anesthetic) during surgery. DIAGNOSIS Your caregiver may ask for tests to be done if the problems do not improve after a few days. Tests may also be done if symptoms are severe or if the reason for the nausea and vomiting is not clear. Tests may include:  Urine tests.  Blood tests.  Stool tests.  Cultures (to look for evidence of infection).  X-rays or other imaging studies. Test results can help your caregiver make decisions about treatment or the need for additional tests. TREATMENT You need to stay well hydrated. Drink frequently but in small amounts.You may wish to drink water, sports drinks, clear broth, or eat frozen ice pops or gelatin dessert to help stay hydrated.When you eat, eating slowly may help prevent nausea.There  are also some antinausea medicines that may help prevent nausea. HOME CARE INSTRUCTIONS   Take all medicine as directed by your caregiver.  If you do not have an appetite, do not force yourself to eat. However, you must continue to drink fluids.  If you have an appetite, eat a normal diet unless your caregiver tells you differently.  Eat a variety of complex carbohydrates (rice, wheat, potatoes, bread), lean meats, yogurt, fruits, and vegetables.  Avoid high-fat foods because they are more difficult to digest.  Drink enough water and fluids to keep your urine clear or pale yellow.  If you are dehydrated, ask your caregiver for specific rehydration instructions. Signs of dehydration may include:  Severe thirst.  Dry lips and mouth.  Dizziness.  Dark urine.  Decreasing urine frequency and amount.  Confusion.  Rapid breathing or pulse. SEEK IMMEDIATE MEDICAL CARE IF:   You have blood or brown flecks (like coffee grounds) in your vomit.  You have black or bloody stools.  You have a severe headache or stiff neck.  You are confused.  You have severe abdominal pain.  You have chest pain or trouble breathing.  You do not urinate at least once every 8 hours.  You develop cold or clammy skin.  You continue to vomit for longer than 24 to 48 hours.  You have a fever. MAKE SURE YOU:   Understand these instructions.  Will watch your condition.  Will get help right away if you are not doing well or get worse. Document Released: 08/01/2005 Document Revised: 10/24/2011 Document Reviewed: 12/29/2010 [facility] Patient Information 2014 [place], [place].

## 2013-10-20 NOTE — ED Notes (Signed)
Pt. walked to and from bathroom , stumbling back and froth.

## 2013-10-21 NOTE — ED Provider Notes (Signed)
Medical screening examination/treatment/procedure(s) were performed by non-physician practitioner and as supervising physician I was immediately available for consultation/collaboration.   EKG Interpretation   Date/Time:  Saturday October 19 2013 18:46:48 EST Ventricular Rate:  75 PR Interval:  143 QRS Duration: 69 QT Interval:  394 QTC Calculation: 440 R Axis:   57 Text Interpretation:  Sinus rhythm Nonspecific T abnormalities, lateral  leads Baseline wander in lead(s) I III aVL aVF V1 V2 V3 V4 V5 V6 No old  tracing to compare Confirmed by WARD,  DO, KRISTEN [phone]) on 10/19/2013  6:53:42 PM       Teressa Lower, MD 10/21/13 [phone]

## 2013-10-22 NOTE — ED Provider Notes (Signed)
Medical screening examination/treatment/procedure(s) were performed by non-physician practitioner and as supervising physician I was immediately available for consultation/collaboration.   EKG Interpretation   Date/Time:  Saturday October 19 2013 18:46:48 EST Ventricular Rate:  75 PR Interval:  143 QRS Duration: 69 QT Interval:  394 QTC Calculation: 440 R Axis:   57 Text Interpretation:  Sinus rhythm Nonspecific T abnormalities, lateral  leads Baseline wander in lead(s) I III aVL aVF V1 V2 V3 V4 V5 V6 No old  tracing to compare Confirmed by Sayuri Rhames,  DO, Sri Clegg [phone]) on 10/19/2013  6:53:42 PM        [place], DO 10/22/13 0003

## 2013-11-27 ENCOUNTER — Encounter: Payer: Self-pay | Admitting: Internal Medicine

## 2014-07-28 ENCOUNTER — Encounter: Payer: Self-pay | Admitting: *Deleted

## 2014-07-28 ENCOUNTER — Encounter: Payer: Managed Care, Other (non HMO) | Attending: Endocrinology | Admitting: *Deleted

## 2014-07-28 DIAGNOSIS — E118 Type 2 diabetes mellitus with unspecified complications: Secondary | ICD-10-CM | POA: Insufficient documentation

## 2014-07-28 DIAGNOSIS — E1165 Type 2 diabetes mellitus with hyperglycemia: Secondary | ICD-10-CM | POA: Insufficient documentation

## 2014-07-28 DIAGNOSIS — Z713 Dietary counseling and surveillance: Secondary | ICD-10-CM | POA: Diagnosis not present

## 2014-07-28 DIAGNOSIS — IMO0002 Reserved for concepts with insufficient information to code with codable children: Secondary | ICD-10-CM

## 2014-07-28 DIAGNOSIS — Z794 Long term (current) use of insulin: Secondary | ICD-10-CM | POA: Insufficient documentation

## 2014-07-28 NOTE — Progress Notes (Signed)
  Medical Nutrition Therapy:  Appt start time: 0830 end time:  0930.  Assessment:  Primary concerns today: Works with [employer], needs food ideas for when she is traveling. History of DM 2 for several years with recent increase in A1c to 12.2%. Just started on insulin last week. She SMBG several times each day now and reports FBG in the 200's, typically better after meals but all above 150 mg/dl until yesterday when she had several in the low 100's. She is walking some around the block at least once a day now.  Preferred Learning Style:   No preference indicated   Learning Readiness:   Ready  Change in progress  MEDICATIONS: see list, Diabetes medications are Novolog 70/30, Metformin and Invokana although she states her Rx's are several hundred dollars so she has not picked up the Invokana Rx lately.   DIETARY INTAKE:  24-hr recall:  B ( AM): oatmeal with nuts and fruit, water with lemon and vinegar  Snk ( AM): not usually  L ( PM): sandwich may not eat the bread OR cottage cheese with fruit and nuts OR vegetable plate Snk ( PM): varies D ( PM): lean meat, vegetables, no starches lately Snk ( PM): PNB, OR vegetables with dip OR nuts and fruit, cheese Beverages: water occasionally with fruit added, infrequently a diet soda  Usual physical activity: limited due to injury, walking some with Fit Bit up to 7000 steps  Estimated energy needs: 1200 calories 135 g carbohydrates 90 g protein 33 g fat  Progress Towards Goal(s):  In progress.   Nutritional Diagnosis:  NB-1.1 Food and nutrition-related knowledge deficit As related to diabetes control.  As evidenced by A1c of 12.2 %.    Intervention:  Nutrition counseling and diabetes education initiated. Discussed Carb Counting by food group as method of portion control, reading food labels, and benefits of increased activity. Reviewed action of her 3 diabetes medications and discussed convenience food items for when she is traveling.    Plan:  Aim for 2 Carb Choices per meal (30 grams) +/- 1 either way  Aim for 0-1 Carbs per snack if hungry  Include protein in moderation with your meals and snacks Continue reading food labels for Total Carbohydrate and Fat Grams of foods Consider  increasing your activity level by walking daily as tolerated Continue checking BG at alternate times per day as directed by MD  Consider taking medication insulin before breakfast and supper as directed by MD  Teaching Method Utilized:  Visual Auditory Hands on  Handouts given during visit include: Living Well with Diabetes Carb Counting and Food Label handouts Meal Plan Card DM Medication and Insulin Action handouts  Snack Food handout  Barriers to learning/adherence to lifestyle change: pain due to work place injury   Demonstrated degree of understanding via:  Teach Back   Monitoring/Evaluation:  Dietary intake, exercise, reading food labels, and body weight prn.

## 2014-07-28 NOTE — Patient Instructions (Signed)
Plan:  Aim for 2 Carb Choices per meal (30 grams) +/- 1 either way  Aim for 0-1 Carbs per snack if hungry  Include protein in moderation with your meals and snacks Continue reading food labels for Total Carbohydrate and Fat Grams of foods Consider  increasing your activity level by walking daily as tolerated Continue checking BG at alternate times per day as directed by MD  Consider taking medication insulin before breakfast and supper as directed by MD  .

## 2014-08-28 ENCOUNTER — Ambulatory Visit: Payer: Managed Care, Other (non HMO) | Admitting: Dietician

## 2015-03-11 ENCOUNTER — Encounter: Payer: Self-pay | Admitting: *Deleted

## 2015-04-03 ENCOUNTER — Encounter: Payer: Self-pay | Admitting: Internal Medicine

## 2015-04-10 ENCOUNTER — Encounter: Payer: Self-pay | Admitting: Internal Medicine

## 2015-12-30 DIAGNOSIS — E119 Type 2 diabetes mellitus without complications: Secondary | ICD-10-CM | POA: Diagnosis not present

## 2015-12-30 DIAGNOSIS — Z0000 Encounter for general adult medical examination without abnormal findings: Secondary | ICD-10-CM | POA: Diagnosis not present

## 2015-12-31 ENCOUNTER — Other Ambulatory Visit: Payer: Self-pay | Admitting: Family Medicine

## 2015-12-31 DIAGNOSIS — E119 Type 2 diabetes mellitus without complications: Secondary | ICD-10-CM | POA: Insufficient documentation

## 2015-12-31 DIAGNOSIS — J309 Allergic rhinitis, unspecified: Secondary | ICD-10-CM | POA: Diagnosis not present

## 2015-12-31 DIAGNOSIS — D696 Thrombocytopenia, unspecified: Secondary | ICD-10-CM | POA: Insufficient documentation

## 2015-12-31 DIAGNOSIS — Z0000 Encounter for general adult medical examination without abnormal findings: Secondary | ICD-10-CM | POA: Diagnosis not present

## 2015-12-31 DIAGNOSIS — F32A Depression, unspecified: Secondary | ICD-10-CM | POA: Diagnosis present

## 2015-12-31 DIAGNOSIS — Z1231 Encounter for screening mammogram for malignant neoplasm of breast: Secondary | ICD-10-CM

## 2015-12-31 DIAGNOSIS — Z794 Long term (current) use of insulin: Secondary | ICD-10-CM | POA: Insufficient documentation

## 2015-12-31 DIAGNOSIS — Z124 Encounter for screening for malignant neoplasm of cervix: Secondary | ICD-10-CM | POA: Diagnosis not present

## 2016-01-08 ENCOUNTER — Ambulatory Visit: Payer: Managed Care, Other (non HMO)

## 2016-01-27 ENCOUNTER — Ambulatory Visit: Payer: Managed Care, Other (non HMO)

## 2016-01-27 ENCOUNTER — Ambulatory Visit
Admission: RE | Admit: 2016-01-27 | Discharge: 2016-01-27 | Disposition: A | Discharge status: Home / Self Care | Payer: BLUE CROSS/BLUE SHIELD | Source: Ambulatory Visit | Attending: Family Medicine | Admitting: Family Medicine

## 2016-01-27 ENCOUNTER — Other Ambulatory Visit: Payer: Self-pay | Admitting: Family Medicine

## 2016-01-27 DIAGNOSIS — Z1231 Encounter for screening mammogram for malignant neoplasm of breast: Secondary | ICD-10-CM

## 2016-02-01 ENCOUNTER — Other Ambulatory Visit: Payer: Self-pay | Admitting: Family Medicine

## 2016-02-01 DIAGNOSIS — R928 Other abnormal and inconclusive findings on diagnostic imaging of breast: Secondary | ICD-10-CM

## 2016-02-24 ENCOUNTER — Ambulatory Visit
Admission: RE | Admit: 2016-02-24 | Discharge: 2016-02-24 | Disposition: A | Discharge status: Home / Self Care | Payer: BLUE CROSS/BLUE SHIELD | Source: Ambulatory Visit | Attending: Family Medicine | Admitting: Family Medicine

## 2016-02-24 DIAGNOSIS — R928 Other abnormal and inconclusive findings on diagnostic imaging of breast: Secondary | ICD-10-CM | POA: Diagnosis not present

## 2016-02-26 ENCOUNTER — Encounter: Payer: Managed Care, Other (non HMO) | Admitting: Gastroenterology

## 2017-11-10 DIAGNOSIS — E1165 Type 2 diabetes mellitus with hyperglycemia: Secondary | ICD-10-CM | POA: Diagnosis not present

## 2017-11-10 DIAGNOSIS — E7800 Pure hypercholesterolemia, unspecified: Secondary | ICD-10-CM | POA: Diagnosis not present

## 2017-11-10 DIAGNOSIS — G609 Hereditary and idiopathic neuropathy, unspecified: Secondary | ICD-10-CM | POA: Diagnosis not present

## 2018-03-21 ENCOUNTER — Other Ambulatory Visit (HOSPITAL_COMMUNITY): Payer: Self-pay | Admitting: Endocrinology

## 2018-03-21 DIAGNOSIS — E1165 Type 2 diabetes mellitus with hyperglycemia: Secondary | ICD-10-CM | POA: Diagnosis not present

## 2018-03-21 DIAGNOSIS — Z9641 Presence of insulin pump (external) (internal): Secondary | ICD-10-CM | POA: Diagnosis not present

## 2018-03-21 DIAGNOSIS — E7800 Pure hypercholesterolemia, unspecified: Secondary | ICD-10-CM | POA: Diagnosis not present

## 2018-03-21 DIAGNOSIS — G609 Hereditary and idiopathic neuropathy, unspecified: Secondary | ICD-10-CM | POA: Diagnosis not present

## 2018-03-21 DIAGNOSIS — E010 Iodine-deficiency related diffuse (endemic) goiter: Secondary | ICD-10-CM

## 2018-03-22 ENCOUNTER — Ambulatory Visit (HOSPITAL_COMMUNITY)
Admission: RE | Admit: 2018-03-22 | Discharge: 2018-03-22 | Disposition: A | Discharge status: Home / Self Care | Payer: BLUE CROSS/BLUE SHIELD | Source: Ambulatory Visit | Attending: Endocrinology | Admitting: Endocrinology

## 2018-03-22 DIAGNOSIS — E010 Iodine-deficiency related diffuse (endemic) goiter: Secondary | ICD-10-CM

## 2018-03-22 DIAGNOSIS — E042 Nontoxic multinodular goiter: Secondary | ICD-10-CM | POA: Insufficient documentation

## 2018-09-24 DIAGNOSIS — E7800 Pure hypercholesterolemia, unspecified: Secondary | ICD-10-CM | POA: Diagnosis not present

## 2018-09-24 DIAGNOSIS — E1165 Type 2 diabetes mellitus with hyperglycemia: Secondary | ICD-10-CM | POA: Diagnosis not present

## 2018-09-24 DIAGNOSIS — E010 Iodine-deficiency related diffuse (endemic) goiter: Secondary | ICD-10-CM | POA: Diagnosis not present

## 2018-09-27 DIAGNOSIS — E7800 Pure hypercholesterolemia, unspecified: Secondary | ICD-10-CM | POA: Diagnosis not present

## 2018-09-27 DIAGNOSIS — Z9641 Presence of insulin pump (external) (internal): Secondary | ICD-10-CM | POA: Diagnosis not present

## 2018-09-27 DIAGNOSIS — E1165 Type 2 diabetes mellitus with hyperglycemia: Secondary | ICD-10-CM | POA: Diagnosis not present

## 2018-09-27 DIAGNOSIS — G609 Hereditary and idiopathic neuropathy, unspecified: Secondary | ICD-10-CM | POA: Diagnosis not present

## 2018-12-27 DIAGNOSIS — F331 Major depressive disorder, recurrent, moderate: Secondary | ICD-10-CM | POA: Diagnosis not present

## 2018-12-27 DIAGNOSIS — F411 Generalized anxiety disorder: Secondary | ICD-10-CM | POA: Diagnosis not present

## 2018-12-27 DIAGNOSIS — F902 Attention-deficit hyperactivity disorder, combined type: Secondary | ICD-10-CM | POA: Diagnosis not present

## 2019-03-10 DIAGNOSIS — N390 Urinary tract infection, site not specified: Secondary | ICD-10-CM | POA: Diagnosis not present

## 2019-03-14 DIAGNOSIS — F331 Major depressive disorder, recurrent, moderate: Secondary | ICD-10-CM | POA: Diagnosis not present

## 2019-03-14 DIAGNOSIS — F411 Generalized anxiety disorder: Secondary | ICD-10-CM | POA: Diagnosis not present

## 2019-03-14 DIAGNOSIS — F902 Attention-deficit hyperactivity disorder, combined type: Secondary | ICD-10-CM | POA: Diagnosis not present

## 2019-03-28 DIAGNOSIS — Z9641 Presence of insulin pump (external) (internal): Secondary | ICD-10-CM | POA: Diagnosis not present

## 2019-03-28 DIAGNOSIS — G609 Hereditary and idiopathic neuropathy, unspecified: Secondary | ICD-10-CM | POA: Diagnosis not present

## 2019-03-28 DIAGNOSIS — E1165 Type 2 diabetes mellitus with hyperglycemia: Secondary | ICD-10-CM | POA: Diagnosis not present

## 2019-03-28 DIAGNOSIS — R319 Hematuria, unspecified: Secondary | ICD-10-CM | POA: Diagnosis not present

## 2019-03-28 DIAGNOSIS — E7800 Pure hypercholesterolemia, unspecified: Secondary | ICD-10-CM | POA: Diagnosis not present

## 2019-03-28 DIAGNOSIS — N390 Urinary tract infection, site not specified: Secondary | ICD-10-CM | POA: Diagnosis not present

## 2019-04-23 DIAGNOSIS — G609 Hereditary and idiopathic neuropathy, unspecified: Secondary | ICD-10-CM | POA: Diagnosis not present

## 2019-04-23 DIAGNOSIS — E1165 Type 2 diabetes mellitus with hyperglycemia: Secondary | ICD-10-CM | POA: Diagnosis not present

## 2019-04-23 DIAGNOSIS — Z9641 Presence of insulin pump (external) (internal): Secondary | ICD-10-CM | POA: Diagnosis not present

## 2019-04-23 DIAGNOSIS — E7800 Pure hypercholesterolemia, unspecified: Secondary | ICD-10-CM | POA: Diagnosis not present

## 2019-06-11 DIAGNOSIS — F331 Major depressive disorder, recurrent, moderate: Secondary | ICD-10-CM | POA: Diagnosis not present

## 2019-06-11 DIAGNOSIS — F902 Attention-deficit hyperactivity disorder, combined type: Secondary | ICD-10-CM | POA: Diagnosis not present

## 2019-06-11 DIAGNOSIS — F411 Generalized anxiety disorder: Secondary | ICD-10-CM | POA: Diagnosis not present

## 2019-07-01 DIAGNOSIS — E1165 Type 2 diabetes mellitus with hyperglycemia: Secondary | ICD-10-CM | POA: Diagnosis not present

## 2019-07-01 DIAGNOSIS — E7800 Pure hypercholesterolemia, unspecified: Secondary | ICD-10-CM | POA: Diagnosis not present

## 2019-07-01 DIAGNOSIS — E010 Iodine-deficiency related diffuse (endemic) goiter: Secondary | ICD-10-CM | POA: Diagnosis not present

## 2019-07-08 DIAGNOSIS — Z9641 Presence of insulin pump (external) (internal): Secondary | ICD-10-CM | POA: Diagnosis not present

## 2019-07-08 DIAGNOSIS — E1165 Type 2 diabetes mellitus with hyperglycemia: Secondary | ICD-10-CM | POA: Diagnosis not present

## 2019-07-08 DIAGNOSIS — G609 Hereditary and idiopathic neuropathy, unspecified: Secondary | ICD-10-CM | POA: Diagnosis not present

## 2019-07-08 DIAGNOSIS — Z23 Encounter for immunization: Secondary | ICD-10-CM | POA: Diagnosis not present

## 2019-07-08 DIAGNOSIS — E7800 Pure hypercholesterolemia, unspecified: Secondary | ICD-10-CM | POA: Diagnosis not present

## 2019-07-29 DIAGNOSIS — Z20828 Contact with and (suspected) exposure to other viral communicable diseases: Secondary | ICD-10-CM | POA: Diagnosis not present

## 2019-09-17 DIAGNOSIS — F411 Generalized anxiety disorder: Secondary | ICD-10-CM | POA: Diagnosis not present

## 2019-09-17 DIAGNOSIS — F902 Attention-deficit hyperactivity disorder, combined type: Secondary | ICD-10-CM | POA: Diagnosis not present

## 2019-09-17 DIAGNOSIS — F331 Major depressive disorder, recurrent, moderate: Secondary | ICD-10-CM | POA: Diagnosis not present

## 2019-10-08 ENCOUNTER — Other Ambulatory Visit: Payer: Self-pay | Admitting: Family Medicine

## 2019-10-08 DIAGNOSIS — E785 Hyperlipidemia, unspecified: Secondary | ICD-10-CM | POA: Insufficient documentation

## 2019-10-08 DIAGNOSIS — Z13220 Encounter for screening for lipoid disorders: Secondary | ICD-10-CM | POA: Diagnosis not present

## 2019-10-08 DIAGNOSIS — Z1231 Encounter for screening mammogram for malignant neoplasm of breast: Secondary | ICD-10-CM

## 2019-10-08 DIAGNOSIS — G4700 Insomnia, unspecified: Secondary | ICD-10-CM | POA: Diagnosis not present

## 2019-10-08 DIAGNOSIS — R002 Palpitations: Secondary | ICD-10-CM | POA: Diagnosis not present

## 2019-10-08 DIAGNOSIS — Z0000 Encounter for general adult medical examination without abnormal findings: Secondary | ICD-10-CM | POA: Diagnosis not present

## 2019-10-08 DIAGNOSIS — L819 Disorder of pigmentation, unspecified: Secondary | ICD-10-CM | POA: Diagnosis not present

## 2019-10-08 DIAGNOSIS — Z1389 Encounter for screening for other disorder: Secondary | ICD-10-CM | POA: Diagnosis not present

## 2019-10-08 DIAGNOSIS — Z124 Encounter for screening for malignant neoplasm of cervix: Secondary | ICD-10-CM | POA: Diagnosis not present

## 2019-10-10 DIAGNOSIS — Z1211 Encounter for screening for malignant neoplasm of colon: Secondary | ICD-10-CM | POA: Diagnosis not present

## 2019-10-10 DIAGNOSIS — R748 Abnormal levels of other serum enzymes: Secondary | ICD-10-CM | POA: Diagnosis not present

## 2019-10-10 DIAGNOSIS — Z86010 Personal history of colonic polyps: Secondary | ICD-10-CM | POA: Diagnosis not present

## 2019-10-10 DIAGNOSIS — R1310 Dysphagia, unspecified: Secondary | ICD-10-CM | POA: Diagnosis not present

## 2019-10-15 ENCOUNTER — Other Ambulatory Visit: Payer: Self-pay | Admitting: Gastroenterology

## 2019-10-15 DIAGNOSIS — R1310 Dysphagia, unspecified: Secondary | ICD-10-CM

## 2019-10-15 DIAGNOSIS — R7989 Other specified abnormal findings of blood chemistry: Secondary | ICD-10-CM

## 2019-10-22 ENCOUNTER — Other Ambulatory Visit: Payer: BLUE CROSS/BLUE SHIELD

## 2019-10-30 ENCOUNTER — Inpatient Hospital Stay: Admission: RE | Admit: 2019-10-30 | Payer: Self-pay | Source: Ambulatory Visit

## 2019-10-30 ENCOUNTER — Other Ambulatory Visit: Payer: Self-pay

## 2019-11-08 ENCOUNTER — Ambulatory Visit: Payer: BLUE CROSS/BLUE SHIELD

## 2019-11-12 DIAGNOSIS — E1165 Type 2 diabetes mellitus with hyperglycemia: Secondary | ICD-10-CM | POA: Diagnosis not present

## 2019-11-12 DIAGNOSIS — Z9641 Presence of insulin pump (external) (internal): Secondary | ICD-10-CM | POA: Diagnosis not present

## 2019-11-12 DIAGNOSIS — E042 Nontoxic multinodular goiter: Secondary | ICD-10-CM | POA: Diagnosis not present

## 2019-11-12 DIAGNOSIS — G609 Hereditary and idiopathic neuropathy, unspecified: Secondary | ICD-10-CM | POA: Diagnosis not present

## 2019-11-27 ENCOUNTER — Other Ambulatory Visit: Payer: Self-pay | Admitting: Family Medicine

## 2019-11-27 ENCOUNTER — Ambulatory Visit
Admission: RE | Admit: 2019-11-27 | Discharge: 2019-11-27 | Disposition: A | Discharge status: Home / Self Care | Payer: BC Managed Care – PPO | Source: Ambulatory Visit | Attending: Family Medicine | Admitting: Family Medicine

## 2019-11-27 ENCOUNTER — Other Ambulatory Visit: Payer: Self-pay

## 2019-11-27 DIAGNOSIS — Z1231 Encounter for screening mammogram for malignant neoplasm of breast: Secondary | ICD-10-CM

## 2019-12-16 DIAGNOSIS — L821 Other seborrheic keratosis: Secondary | ICD-10-CM | POA: Diagnosis not present

## 2019-12-16 DIAGNOSIS — L738 Other specified follicular disorders: Secondary | ICD-10-CM | POA: Diagnosis not present

## 2019-12-16 DIAGNOSIS — D2261 Melanocytic nevi of right upper limb, including shoulder: Secondary | ICD-10-CM | POA: Diagnosis not present

## 2019-12-16 DIAGNOSIS — D225 Melanocytic nevi of trunk: Secondary | ICD-10-CM | POA: Diagnosis not present

## 2019-12-16 DIAGNOSIS — L218 Other seborrheic dermatitis: Secondary | ICD-10-CM | POA: Diagnosis not present

## 2019-12-30 DIAGNOSIS — Z1211 Encounter for screening for malignant neoplasm of colon: Secondary | ICD-10-CM | POA: Diagnosis not present

## 2019-12-30 DIAGNOSIS — D125 Benign neoplasm of sigmoid colon: Secondary | ICD-10-CM | POA: Diagnosis not present

## 2019-12-30 DIAGNOSIS — K635 Polyp of colon: Secondary | ICD-10-CM | POA: Diagnosis not present

## 2019-12-30 DIAGNOSIS — D122 Benign neoplasm of ascending colon: Secondary | ICD-10-CM | POA: Diagnosis not present

## 2020-02-18 DIAGNOSIS — G609 Hereditary and idiopathic neuropathy, unspecified: Secondary | ICD-10-CM | POA: Diagnosis not present

## 2020-02-18 DIAGNOSIS — Z9641 Presence of insulin pump (external) (internal): Secondary | ICD-10-CM | POA: Diagnosis not present

## 2020-02-18 DIAGNOSIS — E042 Nontoxic multinodular goiter: Secondary | ICD-10-CM | POA: Diagnosis not present

## 2020-02-18 DIAGNOSIS — E1165 Type 2 diabetes mellitus with hyperglycemia: Secondary | ICD-10-CM | POA: Diagnosis not present

## 2020-05-11 DIAGNOSIS — Z9641 Presence of insulin pump (external) (internal): Secondary | ICD-10-CM | POA: Diagnosis not present

## 2020-05-11 DIAGNOSIS — E1165 Type 2 diabetes mellitus with hyperglycemia: Secondary | ICD-10-CM | POA: Diagnosis not present

## 2020-05-11 DIAGNOSIS — E042 Nontoxic multinodular goiter: Secondary | ICD-10-CM | POA: Diagnosis not present

## 2020-05-11 DIAGNOSIS — Z23 Encounter for immunization: Secondary | ICD-10-CM | POA: Diagnosis not present

## 2020-05-11 DIAGNOSIS — G609 Hereditary and idiopathic neuropathy, unspecified: Secondary | ICD-10-CM | POA: Diagnosis not present

## 2022-01-12 IMAGING — MG DIGITAL SCREENING UNILAT RIGHT IMPLANT  W/ TOMO W/ CAD
8 series · 8 of 20 positions shown · non-contrast
Comparison: Previous exam(s).

CLINICAL DATA: Screening. History of left mastectomy.

EXAM:
DIGITAL SCREENING UNILATERAL RIGHT MAMMOGRAM WITH IMPLANTS, CAD AND
TOMO
The patient has a prepectoral implant. Standard and implant
displaced views were performed.

[R MLO]
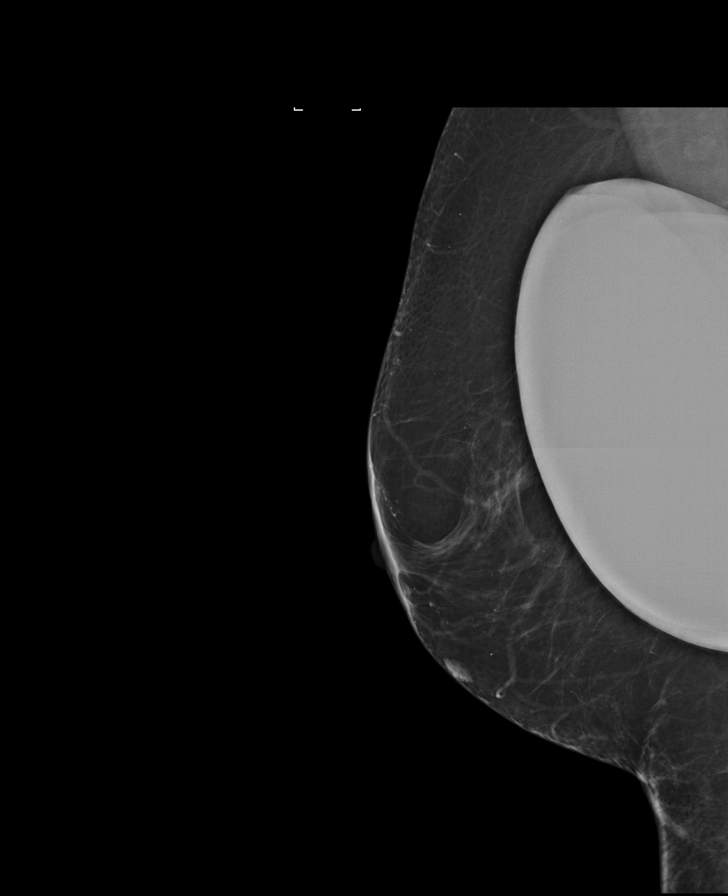

[R CC]
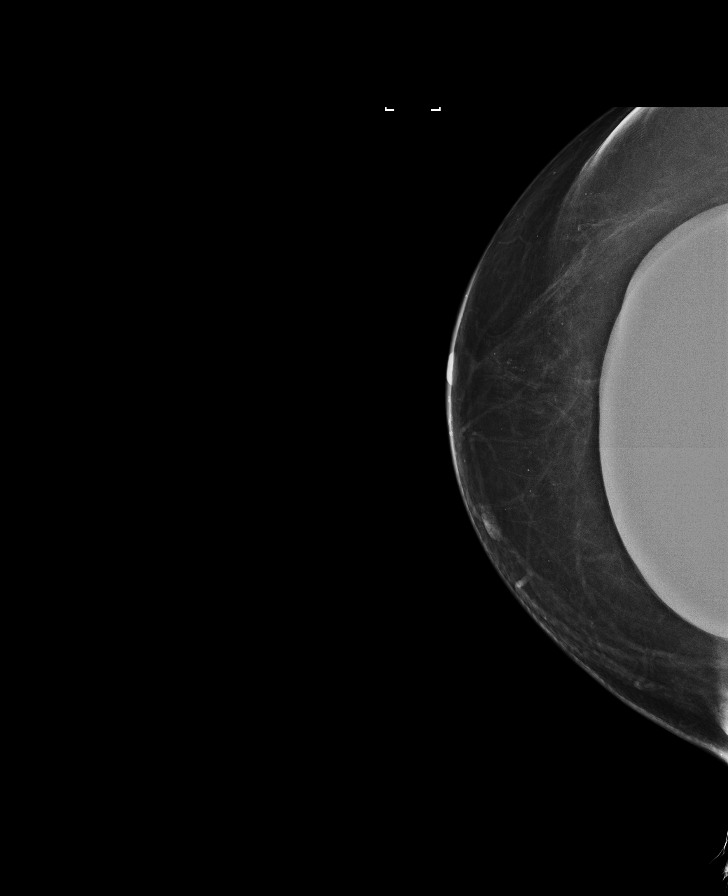

[R MLO synth-2D (1 of 2)]
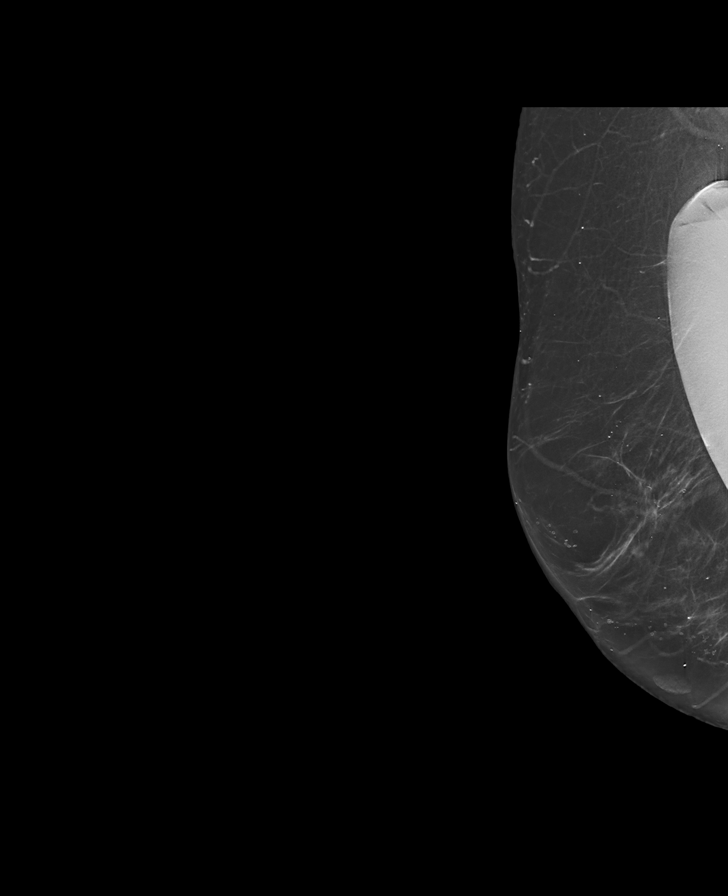

[R MLO synth-2D (2 of 2)]
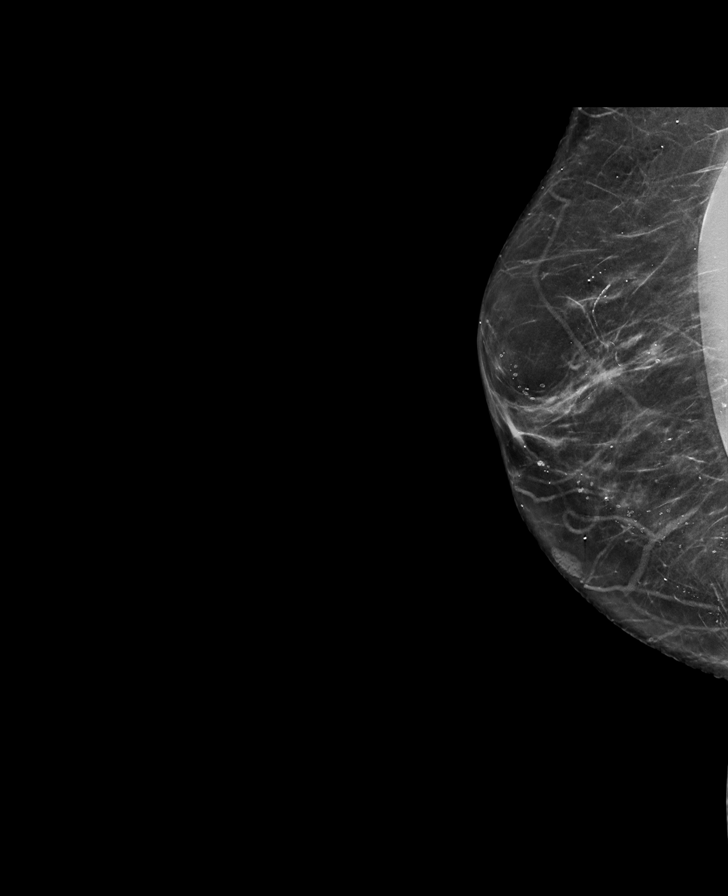

[R CC synth-2D]
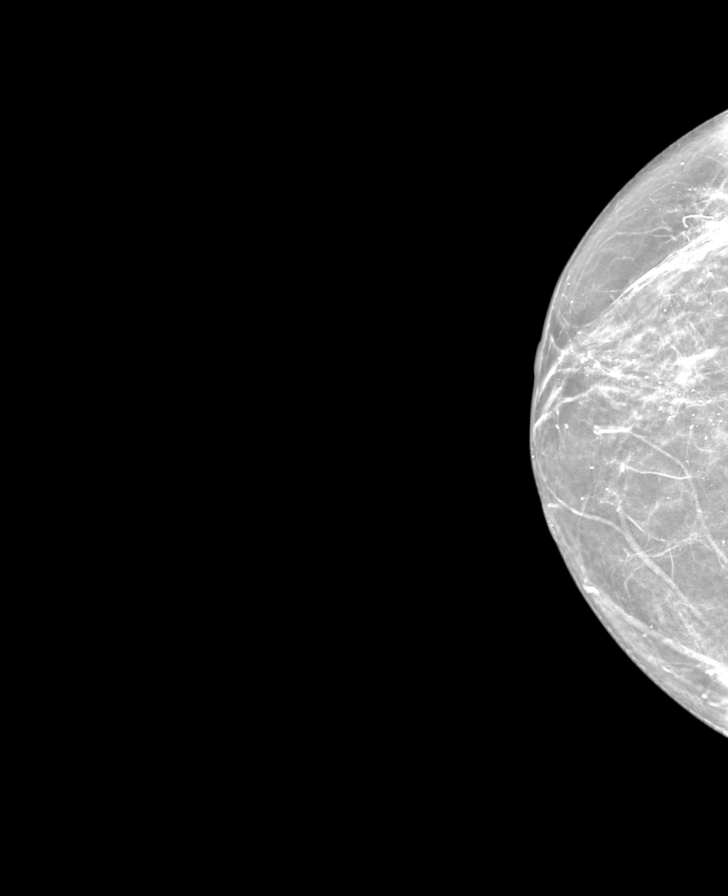

[R MLO tomo (1 of 2) · tomo slice 33/66.0]
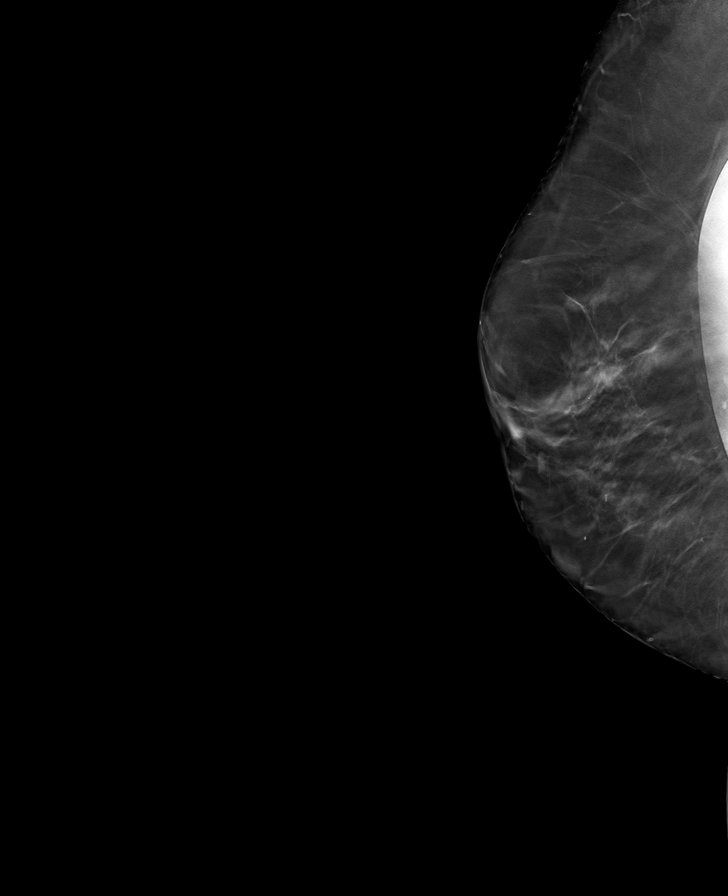

[R CC tomo · tomo slice 31/62.0]
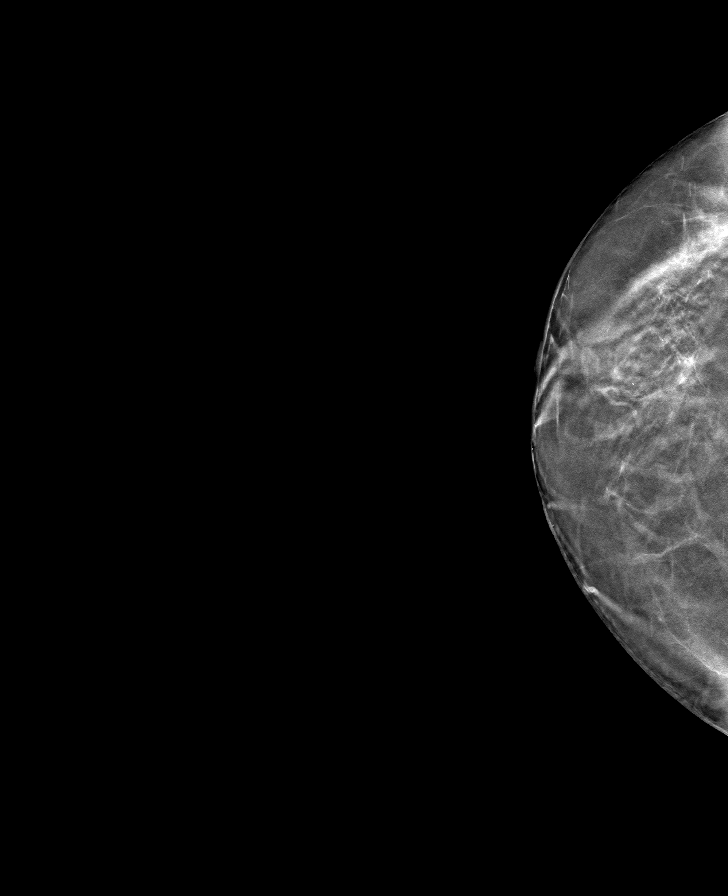

[R MLO tomo (2 of 2) · tomo slice 37/74.0]
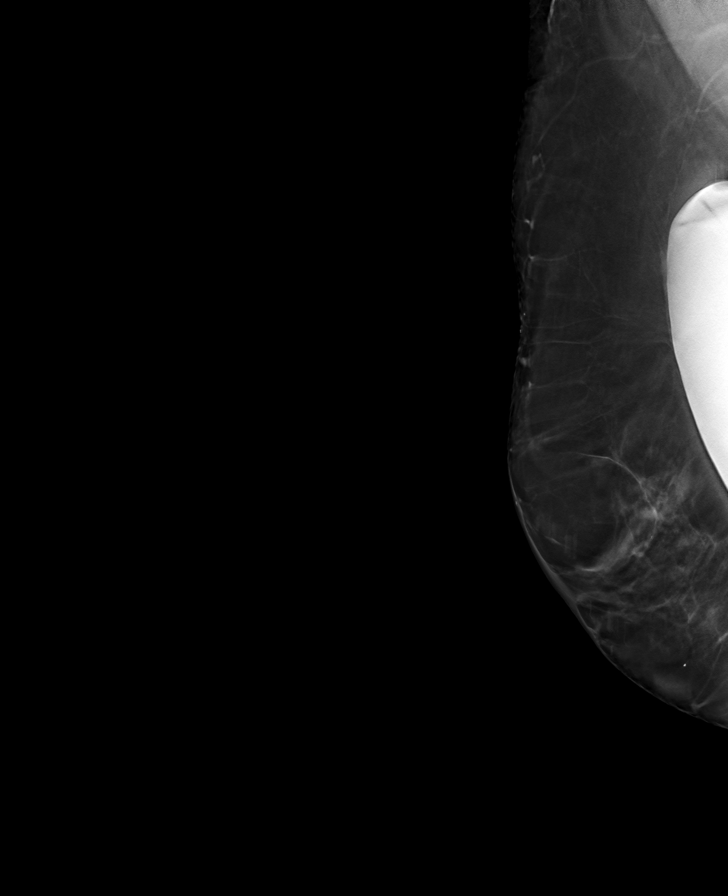

[8 of 20 positions shown; findings below may reference images not displayed]

ACR Breast Density Category b: There are scattered areas of
fibroglandular density.
FINDINGS: There are no findings suspicious for malignancy. A right prepectoral
silicone implant is noted. Images were processed with CAD.
IMPRESSION: No mammographic evidence of malignancy. A result letter of this
screening mammogram will be mailed directly to the patient.

RECOMMENDATION:
Screening mammogram in one year. (Code:9Z-D-3Q8)

BI-RADS CATEGORY  1:  Negative.

## 2023-02-23 ENCOUNTER — Encounter (HOSPITAL_BASED_OUTPATIENT_CLINIC_OR_DEPARTMENT_OTHER): Payer: Self-pay | Admitting: Emergency Medicine

## 2023-02-23 ENCOUNTER — Other Ambulatory Visit: Payer: Self-pay

## 2023-02-23 ENCOUNTER — Emergency Department (HOSPITAL_BASED_OUTPATIENT_CLINIC_OR_DEPARTMENT_OTHER)
Admission: EM | Admit: 2023-02-23 | Discharge: 2023-02-23 | Disposition: A | Discharge status: Home / Self Care | Payer: BC Managed Care – PPO | Attending: Emergency Medicine | Admitting: Emergency Medicine

## 2023-02-23 DIAGNOSIS — E1165 Type 2 diabetes mellitus with hyperglycemia: Secondary | ICD-10-CM | POA: Diagnosis not present

## 2023-02-23 DIAGNOSIS — R718 Other abnormality of red blood cells: Secondary | ICD-10-CM | POA: Insufficient documentation

## 2023-02-23 DIAGNOSIS — D72829 Elevated white blood cell count, unspecified: Secondary | ICD-10-CM | POA: Insufficient documentation

## 2023-02-23 DIAGNOSIS — E878 Other disorders of electrolyte and fluid balance, not elsewhere classified: Secondary | ICD-10-CM | POA: Insufficient documentation

## 2023-02-23 DIAGNOSIS — R748 Abnormal levels of other serum enzymes: Secondary | ICD-10-CM | POA: Insufficient documentation

## 2023-02-23 DIAGNOSIS — E871 Hypo-osmolality and hyponatremia: Secondary | ICD-10-CM | POA: Insufficient documentation

## 2023-02-23 DIAGNOSIS — R739 Hyperglycemia, unspecified: Secondary | ICD-10-CM

## 2023-02-23 LAB — COMPREHENSIVE METABOLIC PANEL
ALT: 11 U/L (ref 0–44)
AST: 11 U/L — ABNORMAL LOW (ref 15–41)
Albumin: 4.5 g/dL (ref 3.5–5.0)
Alkaline Phosphatase: 154 U/L — ABNORMAL HIGH (ref 38–126)
Anion gap: 15 (ref 5–15)
BUN: 9 mg/dL (ref 8–23)
CO2: 21 mmol/L — ABNORMAL LOW (ref 22–32)
Calcium: 10 mg/dL (ref 8.9–10.3)
Chloride: 96 mmol/L — ABNORMAL LOW (ref 98–111)
Creatinine, Ser: 0.64 mg/dL (ref 0.44–1.00)
GFR, Estimated: >60 mL/min (ref >60)
Glucose, Bld: 600 mg/dL (ref 70–99)
Potassium: 4.3 mmol/L (ref 3.5–5.1)
Sodium: 132 mmol/L — ABNORMAL LOW (ref 135–145)
Total Bilirubin: 0.6 mg/dL (ref 0.3–1.2)
Total Protein: 6.7 g/dL (ref 6.5–8.1)

## 2023-02-23 LAB — URINALYSIS, ROUTINE W REFLEX MICROSCOPIC
Bacteria, UA: NONE SEEN
Bilirubin Urine: NEGATIVE
Glucose, UA: >1000 mg/dL — AB
Ketones, ur: 40 mg/dL — AB
Leukocytes,Ua: NEGATIVE
Nitrite: NEGATIVE
Protein, ur: NEGATIVE mg/dL
RBC / HPF: >50 RBC/hpf (ref 0–5)
Specific Gravity, Urine: 1.037 — ABNORMAL HIGH (ref 1.005–1.030)
pH: 5.5 (ref 5.0–8.0)

## 2023-02-23 LAB — CBC
HCT: 46.3 % — ABNORMAL HIGH (ref 36.0–46.0)
Hemoglobin: 15.9 g/dL — ABNORMAL HIGH (ref 12.0–15.0)
MCH: 29.4 pg (ref 26.0–34.0)
MCHC: 34.3 g/dL (ref 30.0–36.0)
MCV: 85.7 fL (ref 80.0–100.0)
Platelets: 391 10*3/uL (ref 150–400)
RBC: 5.4 MIL/uL — ABNORMAL HIGH (ref 3.87–5.11)
RDW: 12 % (ref 11.5–15.5)
WBC: 10.9 10*3/uL — ABNORMAL HIGH (ref 4.0–10.5)
nRBC: 0 % (ref 0.0–0.2)

## 2023-02-23 LAB — RAPID URINE DRUG SCREEN, HOSP PERFORMED
Amphetamines: NOT DETECTED
Barbiturates: NOT DETECTED
Benzodiazepines: NOT DETECTED
Cocaine: NOT DETECTED
Opiates: NOT DETECTED
Tetrahydrocannabinol: NOT DETECTED

## 2023-02-23 LAB — ACETAMINOPHEN LEVEL: Acetaminophen (Tylenol), Serum: <10 ug/mL — ABNORMAL LOW (ref 10–30)

## 2023-02-23 LAB — ETHANOL: Alcohol, Ethyl (B): <10 mg/dL (ref <10)

## 2023-02-23 LAB — SALICYLATE LEVEL: Salicylate Lvl: <7 mg/dL — ABNORMAL LOW (ref 7.0–30.0)

## 2023-02-23 LAB — CBG MONITORING, ED: Glucose-Capillary: 254 mg/dL — ABNORMAL HIGH (ref 70–99)

## 2023-02-23 MED ORDER — BD INSULIN SYRINGE U-100 1 ML MISC: 1.0000 | Freq: Two times a day (BID) | 0 refills | Status: AC

## 2023-02-23 MED ORDER — SODIUM CHLORIDE 0.9 % IV BOLUS
1000.0000 mL | Freq: Once | INTRAVENOUS | Status: AC
  Administered 2023-02-23: 1000 mL via INTRAVENOUS

## 2023-02-23 MED ORDER — INSULIN ASPART 100 UNIT/ML IJ SOLN
10.0000 [IU] | Freq: Once | INTRAMUSCULAR | Status: AC
  Administered 2023-02-23: 10 [IU] via SUBCUTANEOUS

## 2023-02-23 MED ORDER — INSULIN REGULAR HUMAN 100 UNIT/ML IJ SOLN: 10.0000 [IU] | Freq: Once | INTRAMUSCULAR | Status: DC

## 2023-02-23 MED ORDER — INSULIN ASPART PROT & ASPART (70-30 MIX) 100 UNIT/ML ~~LOC~~ SUSP: 10.0000 [IU] | Freq: Two times a day (BID) | SUBCUTANEOUS | 11 refills | Status: AC

## 2023-02-23 NOTE — ED Notes (Signed)
Pt. Just ate a Big Mac and large fries without notifying nurse

## 2023-02-23 NOTE — ED Notes (Addendum)
Pt denies c/o pain/illness. States she has been having increasingly more frequent panic attacks and has also stopped taking care of herself, taking medications (insulin dependent diabetic), showering, etc.

## 2023-02-23 NOTE — ED Provider Notes (Signed)
[place] EMERGENCY DEPARTMENT AT [facility] Provider Note   CSN: [id number] Arrival date & time: 02/23/23  1201     History  Chief Complaint  Patient presents with   Mental Health Problem    Lacey Perkins is a 67 y.o. female.  Patient with history of T2DM presents today with complaints of medication nonadherance. She presents with her mom who brought her in, stating that the patient has been feeling 'down' for the past few months and has stopped caring for herself.  She notes that she has not been taking her medications at all for the last few months.  This includes her metformin and insulin. She states that she has been losing weight as well and noted decreased appetite and increased urinary frequency. She states that her mother just found out that she wasn't taking her medications and insisted she come in for evaluation. Patient denies any fevers, chills, chest pain, shortness of breath, nausea, vomiting, or abdominal pain. She denies any SI/HI or AVH. She notes that she has plenty of metformin at home as well as a glucometer and test strips but no longer has any insulin.  She has not been checking her sugars.  The history is provided by the patient. No language interpreter was used.  Mental Health Problem      Home Medications Prior to Admission medications   Medication Sig Start Date End Date Taking? Authorizing Provider  diazepam (VALIUM) 5 MG tablet Take 1 tablet (5 mg total) by mouth 2 (two) times daily. Patient not taking: Reported on 02/23/2023 10/20/13   Cherrie Distance, PA-C  ondansetron (ZOFRAN) 4 MG tablet Take 1 tablet (4 mg total) by mouth every 6 (six) hours. Patient not taking: Reported on 02/23/2023 10/20/13   Cherrie Distance, PA-C      Allergies    Morphine, Penicillins, and Sulfonamide derivatives    Review of Systems   Review of Systems  Endocrine: Positive for polydipsia and polyuria.  All other systems reviewed and are  negative.   Physical Exam Updated Vital Signs BP (!) 140/74   Pulse 98   Temp 98.6 F (37 C) (Oral)   Resp 16   Ht 5\' 3"  (1.6 m)   Wt 52.2 kg   SpO2 97%   BMI 20.37 kg/m  Physical Exam Vitals and nursing note reviewed.  Constitutional:      General: She is not in acute distress.    Appearance: Normal appearance. She is normal weight. She is not ill-appearing, toxic-appearing or diaphoretic.  HENT:     Head: Normocephalic and atraumatic.  Cardiovascular:     Rate and Rhythm: Normal rate.  Pulmonary:     Effort: Pulmonary effort is normal. No respiratory distress.  Abdominal:     General: Abdomen is flat.     Palpations: Abdomen is soft.     Tenderness: There is no abdominal tenderness.  Musculoskeletal:        General: Normal range of motion.     Cervical back: Normal range of motion.  Skin:    General: Skin is warm and dry.  Neurological:     General: No focal deficit present.     Mental Status: She is alert.  Psychiatric:        Mood and Affect: Mood normal.        Behavior: Behavior normal.     ED Results / Procedures / Treatments   Labs (all labs ordered are listed, but only abnormal results are displayed) Labs Reviewed  COMPREHENSIVE METABOLIC PANEL - Abnormal; Notable for the following components:      Result Value   Sodium 132 (*)    Chloride 96 (*)    CO2 21 (*)    Glucose, Bld 600 (*)    AST 11 (*)    Alkaline Phosphatase 154 (*)    All other components within normal limits  SALICYLATE LEVEL - Abnormal; Notable for the following components:   Salicylate Lvl <7.0 (*)    All other components within normal limits  ACETAMINOPHEN LEVEL - Abnormal; Notable for the following components:   Acetaminophen (Tylenol), Serum <10 (*)    All other components within normal limits  CBC - Abnormal; Notable for the following components:   WBC 10.9 (*)    RBC 5.40 (*)    Hemoglobin 15.9 (*)    HCT 46.3 (*)    All other components within normal limits   URINALYSIS, ROUTINE W REFLEX MICROSCOPIC - Abnormal; Notable for the following components:   Specific Gravity, Urine 1.037 (*)    Glucose, UA >1,000 (*)    Hgb urine dipstick LARGE (*)    Ketones, ur 40 (*)    All other components within normal limits  CBG MONITORING, ED - Abnormal; Notable for the following components:   Glucose-Capillary 254 (*)    All other components within normal limits  ETHANOL  RAPID URINE DRUG SCREEN, HOSP PERFORMED    EKG None  Radiology No results found.  Procedures Procedures    Medications Ordered in ED Medications  sodium chloride 0.9 % bolus 1,000 mL (0 mLs Intravenous Stopped 02/23/23 1618)  insulin aspart (novoLOG) injection 10 Units (10 Units Subcutaneous Given 02/23/23 1551)    ED Course/ Medical Decision Making/ A&P                             Medical Decision Making Amount and/or Complexity of Data Reviewed Labs: ordered.  Risk Prescription drug management.   This patient is a 67 y.o. female who presents to the ED for concern of medication nonadherance, this involves an extensive number of treatment options, and is a complaint that carries with it a high risk of complications and morbidity. The emergent differential diagnosis prior to evaluation includes, but is not limited to,  DKA/HHS, hyperglycemia . This is not an exhaustive differential.   Past Medical History / Co-morbidities / Social History: history of T2DM  Additional history: Chart reviewed. Pertinent results include: discussed with mom at bedside, states that she has not been taking care of herself for the past few months  Physical Exam: Physical exam performed. The pertinent findings include: per above, no pertinent physical exam findings  Lab Tests: I ordered, and personally interpreted labs.  The pertinent results include:  WBC 10.9, hgb 15.9. Na 132. Chloride 96, bicarb 21. Glucose 600 --> 254. Anion gap WNL. Alk phos 154 consistent with previous. UA with glucose  and ketones   Medications: I ordered medication including fluids, insulin  for hyperglycemia. Reevaluation of the patient after these medicines showed that the patient improved. I have reviewed the patients home medicines and have made adjustments as needed.  Consultations: Discussed patient with pharmacy on call who assisted with insulin prescription   Disposition: After consideration of the diagnostic results and the patients response to treatment, I feel that emergency department workup does not suggest an emergent condition requiring admission or immediate intervention beyond what has been performed at this time. The  plan is: discharge with close outpatient follow-up and medication prescriptions. She is not in DKA. She has plenty of her metformin, glucometer, and test strips. She just needs her insulin. Chart reviewed, patient used to be on novolog 70/30. Will send for same. Recommend routine glucose checks as well. She has a pcp. Recommend close follow-up. She is not SI/HI, no indication for TTS consult. Evaluation and diagnostic testing in the emergency department does not suggest an emergent condition requiring admission or immediate intervention beyond what has been performed at this time.  Plan for discharge with close PCP follow-up.  Patient is understanding and amenable with plan, educated on red flag symptoms that would prompt immediate return.  Patient discharged in stable condition.  Final Clinical Impression(s) / ED Diagnoses Final diagnoses:  Hyperglycemia    Rx / DC Orders ED Discharge Orders          Ordered    insulin aspart protamine- aspart (NOVOLOG MIX 70/30) (70-30) 100 UNIT/ML injection  2 times daily with meals        02/23/23 2017    Insulin Syringes, Disposable, (B-D INSULIN SYRINGE 1CC) U-100 1 ML MISC  2 times daily        02/23/23 2017          An After Visit Summary was printed and given to the patient.     Vear Clock 02/23/23 2020     Tegeler, Canary Brim, MD 02/24/23 [phone]

## 2023-02-23 NOTE — Discharge Instructions (Signed)
As we discussed, your blood sugar was quite high in the emergency department today.  This is likely due to you not taking your medications as prescribed.  We have gotten your sugar down to 250 emergency department today and you are not displaying any signs of DKA.  Given this, it is reasonable for you to be discharged home.  It is very important that you have close follow-up with your primary care doctor at your earliest convenience.  I have given you a refill of your insulin and you need to give it to yourself daily and check your sugar regularly as well.  Please restart your home metformin also.  Return if development of any new or worsening symptoms.

## 2023-02-23 NOTE — ED Triage Notes (Signed)
Pt arrives to ED with c/o on-going panic and anxiety attacks x1 year. Pt states she has been losing weight, not paying her bills, not cleaning,  and stopped taking her medicine.

## 2023-08-06 ENCOUNTER — Other Ambulatory Visit: Payer: Self-pay

## 2023-08-06 ENCOUNTER — Emergency Department (HOSPITAL_COMMUNITY)
Admission: EM | Admit: 2023-08-06 | Discharge: 2023-08-08 | Disposition: A | Discharge status: Other Acute Inpatient Hospital | Payer: Self-pay | Attending: Emergency Medicine | Admitting: Emergency Medicine

## 2023-08-06 ENCOUNTER — Encounter (HOSPITAL_COMMUNITY): Payer: Self-pay

## 2023-08-06 DIAGNOSIS — F32A Depression, unspecified: Secondary | ICD-10-CM | POA: Diagnosis not present

## 2023-08-06 DIAGNOSIS — E119 Type 2 diabetes mellitus without complications: Secondary | ICD-10-CM | POA: Insufficient documentation

## 2023-08-06 DIAGNOSIS — Z794 Long term (current) use of insulin: Secondary | ICD-10-CM | POA: Insufficient documentation

## 2023-08-06 LAB — COMPREHENSIVE METABOLIC PANEL
ALT: 16 U/L (ref 0–44)
AST: 19 U/L (ref 15–41)
Albumin: 4.7 g/dL (ref 3.5–5.0)
Alkaline Phosphatase: 124 U/L (ref 38–126)
Anion gap: 13 (ref 5–15)
BUN: 17 mg/dL (ref 8–23)
CO2: 23 mmol/L (ref 22–32)
Calcium: 10.5 mg/dL — ABNORMAL HIGH (ref 8.9–10.3)
Chloride: 99 mmol/L (ref 98–111)
Creatinine, Ser: 0.46 mg/dL (ref 0.44–1.00)
GFR, Estimated: >60 mL/min (ref >60)
Glucose, Bld: 389 mg/dL — ABNORMAL HIGH (ref 70–99)
Potassium: 4.5 mmol/L (ref 3.5–5.1)
Sodium: 135 mmol/L (ref 135–145)
Total Bilirubin: 1.1 mg/dL (ref <1.2)
Total Protein: 8.5 g/dL — ABNORMAL HIGH (ref 6.5–8.1)

## 2023-08-06 LAB — RAPID URINE DRUG SCREEN, HOSP PERFORMED
Amphetamines: NOT DETECTED
Barbiturates: NOT DETECTED
Benzodiazepines: NOT DETECTED
Cocaine: NOT DETECTED
Opiates: NOT DETECTED
Tetrahydrocannabinol: NOT DETECTED

## 2023-08-06 LAB — ETHANOL: Alcohol, Ethyl (B): <10 mg/dL (ref <10)

## 2023-08-06 LAB — CBC
HCT: 48.7 % — ABNORMAL HIGH (ref 36.0–46.0)
Hemoglobin: 17.2 g/dL — ABNORMAL HIGH (ref 12.0–15.0)
MCH: 30 pg (ref 26.0–34.0)
MCHC: 35.3 g/dL (ref 30.0–36.0)
MCV: 84.8 fL (ref 80.0–100.0)
Platelets: 366 10*3/uL (ref 150–400)
RBC: 5.74 MIL/uL — ABNORMAL HIGH (ref 3.87–5.11)
RDW: 11.9 % (ref 11.5–15.5)
WBC: 8.4 10*3/uL (ref 4.0–10.5)
nRBC: 0 % (ref 0.0–0.2)

## 2023-08-06 LAB — CBG MONITORING, ED: Glucose-Capillary: 222 mg/dL — ABNORMAL HIGH (ref 70–99)

## 2023-08-06 LAB — SALICYLATE LEVEL: Salicylate Lvl: <7 mg/dL — ABNORMAL LOW (ref 7.0–30.0)

## 2023-08-06 LAB — ACETAMINOPHEN LEVEL: Acetaminophen (Tylenol), Serum: <10 ug/mL — ABNORMAL LOW (ref 10–30)

## 2023-08-06 MED ORDER — INSULIN ASPART PROT & ASPART (70-30 MIX) 100 UNIT/ML ~~LOC~~ SUSP
10.0000 [IU] | Freq: Two times a day (BID) | SUBCUTANEOUS | Status: DC
  Administered 2023-08-07 – 2023-08-08 (×3): 10 [IU] via SUBCUTANEOUS
  Filled 2023-08-06: qty 10

## 2023-08-06 MED ORDER — INSULIN ASPART PROT & ASPART (70-30 MIX) 100 UNIT/ML ~~LOC~~ SUSP
10.0000 [IU] | Freq: Once | SUBCUTANEOUS | Status: AC
  Administered 2023-08-06: 10 [IU] via SUBCUTANEOUS
  Filled 2023-08-06: qty 10

## 2023-08-06 NOTE — ED Provider Notes (Signed)
[place] EMERGENCY DEPARTMENT AT [facility] Provider Note   CSN: [id number] Arrival date & time: 08/06/23  1224     History  Chief Complaint  Patient presents with   Psychiatric Evaluation    Lacey Perkins is a 67 y.o. female.  HPI     67 year old female with history of ADHD, diabetes comes in with chief complaint of depression.  Patient states that she has never been diagnosed with depression.  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  After COVID, she has slowly started getting worse.  She thinks that she hit the wall several months ago but just did not have motivation to do anything about it.  She has not left her house since Mother's Day.  Family has been checking on her, and finally she gave into coming to the ER for further assessment.  She has had suicidal thoughts but without any plans or serious attempts in the past.  Patient thinks that she is depressed.  She has no energy and does not have motivation to do anything.  She watches TV all day.  She gets groceries delivered.  She does not socialize with anyone.  She has not been taking her diabetes insulin as well.  She simply does not care anymore.  She wants to feel better.  Home Medications Prior to Admission medications   Medication Sig Start Date End Date Taking? Authorizing Provider  diazepam (VALIUM) 5 MG tablet Take 1 tablet (5 mg total) by mouth 2 (two) times daily. Patient not taking: Reported on 02/23/2023 10/20/13   Cherrie Distance, PA-C  insulin aspart protamine- aspart (NOVOLOG MIX 70/30) (70-30) 100 UNIT/ML injection Inject 0.1 mLs (10 Units total) into the skin 2 (two) times daily with a meal. 02/23/23   Smoot, Sarah A, PA-C  Insulin Syringes, Disposable, (B-D INSULIN SYRINGE 1CC) U-100 1 ML MISC 1 each by Does not apply route in the morning and at bedtime. 02/23/23   Smoot, Sarah A, PA-C  ondansetron (ZOFRAN) 4 MG tablet Take 1 tablet (4 mg total) by mouth every 6 (six)  hours. Patient not taking: Reported on 02/23/2023 10/20/13   Cherrie Distance, PA-C      Allergies    Morphine, Penicillins, and Sulfonamide derivatives    Review of Systems   Review of Systems  All other systems reviewed and are negative.   Physical Exam Updated Vital Signs BP (!) 157/92   Pulse (!) 103   Temp 98.3 F (36.8 C) (Oral)   Resp 16   Ht 5\' 3"  (1.6 m)   Wt 53.5 kg   SpO2 99%   BMI 20.90 kg/m  Physical Exam Vitals and nursing note reviewed.  Constitutional:      Appearance: She is well-developed.  HENT:     Head: Atraumatic.  Cardiovascular:     Rate and Rhythm: Normal rate.  Pulmonary:     Effort: Pulmonary effort is normal.  Musculoskeletal:     Cervical back: Normal range of motion and neck supple.  Skin:    General: Skin is warm and dry.  Neurological:     Mental Status: She is alert and oriented to person, place, and time.  Psychiatric:        Attention and Perception: Attention normal.        Mood and Affect: Mood is depressed. Affect is flat.        Speech: Speech normal.        Behavior: Behavior is withdrawn. Behavior is  cooperative.        Thought Content: Thought content normal.     ED Results / Procedures / Treatments   Labs (all labs ordered are listed, but only abnormal results are displayed) Labs Reviewed  COMPREHENSIVE METABOLIC PANEL - Abnormal; Notable for the following components:      Result Value   Glucose, Bld 389 (*)    Calcium 10.5 (*)    Total Protein 8.5 (*)    All other components within normal limits  SALICYLATE LEVEL - Abnormal; Notable for the following components:   Salicylate Lvl <7.0 (*)    All other components within normal limits  ACETAMINOPHEN LEVEL - Abnormal; Notable for the following components:   Acetaminophen (Tylenol), Serum <10 (*)    All other components within normal limits  CBC - Abnormal; Notable for the following components:   RBC 5.74 (*)    Hemoglobin 17.2 (*)    HCT 48.7 (*)    All other  components within normal limits  ETHANOL  RAPID URINE DRUG SCREEN, HOSP PERFORMED    EKG None  Radiology No results found.  Procedures Procedures    Medications Ordered in ED Medications  insulin aspart protamine- aspart (NOVOLOG MIX 70/30) injection 10 Units (has no administration in time range)  insulin aspart protamine- aspart (NOVOLOG MIX 70/30) injection 10 Units (has no administration in time range)    ED Course/ Medical Decision Making/ A&P                                 Medical Decision Making Amount and/or Complexity of Data Reviewed Labs: ordered.  Risk Prescription drug management.   Patient comes to the ER with cc of depression. Patient has pertinent past medical history of ADHD, diabetes.  She states that she likely has been depressed for several months now, likely after COVID sometime. Currently patient is calm, cooperative.  Pt denies nausea, emesis, fevers, chills, chest pains, shortness of breath, headaches, abdominal pain, uti like symptoms and patient has no active medical complaints. I have reviewed previous encounters for this patient and reviewed their primary medications.  Differential diagnosis considered for this patient includes: Depression Bipolar disorder Schizophrenia Substance abuse Suicidal ideation Acute withdrawal  Clinically appears that patient is likely having new major depression. Appropriate labs have been ordered.  Labs reviewed, they are reassuring. Blood sugar is slightly elevated, for which we will give her subcu insulin.  We will restart her home insulin. Patient is medically cleared for psychiatric evaluation.    Final Clinical Impression(s) / ED Diagnoses Final diagnoses:  Depression, unspecified depression type    Rx / DC Orders ED Discharge Orders     None         Derwood Kaplan, MD 08/06/23 1442

## 2023-08-06 NOTE — ED Triage Notes (Signed)
Pt arrives via POV. Family member reports patient has stopped taking care of herself, she stopped her meds several months ago, she has lost weight, she has basically been staying indoors most of the time, and has been having recurrent panic attacks. Pt arrives AxOx4, she agrees with family member. Pt states she is very depressed. She denies HI, she did answer yes to the first CSSRS, but states she would never hurt herself.

## 2023-08-07 LAB — CBG MONITORING, ED
Glucose-Capillary: 336 mg/dL — ABNORMAL HIGH (ref 70–99)
Glucose-Capillary: 324 mg/dL — ABNORMAL HIGH (ref 70–99)
Glucose-Capillary: 276 mg/dL — ABNORMAL HIGH (ref 70–99)
Glucose-Capillary: 327 mg/dL — ABNORMAL HIGH (ref 70–99)
Glucose-Capillary: 364 mg/dL — ABNORMAL HIGH (ref 70–99)

## 2023-08-07 MED ORDER — INSULIN ASPART 100 UNIT/ML IJ SOLN
0.0000 [IU] | Freq: Three times a day (TID) | INTRAMUSCULAR | Status: DC
  Administered 2023-08-07: 7 [IU] via SUBCUTANEOUS
  Administered 2023-08-07 – 2023-08-08 (×2): 5 [IU] via SUBCUTANEOUS
  Filled 2023-08-07: qty 0.09

## 2023-08-07 MED ORDER — INSULIN ASPART 100 UNIT/ML IJ SOLN
0.0000 [IU] | Freq: Every day | INTRAMUSCULAR | Status: DC
  Administered 2023-08-07: 5 [IU] via SUBCUTANEOUS
  Filled 2023-08-07: qty 0.05

## 2023-08-07 MED ORDER — ZOLPIDEM TARTRATE 5 MG PO TABS
5.0000 mg | ORAL_TABLET | Freq: Every evening | ORAL | Status: DC | PRN
  Administered 2023-08-07: 5 mg via ORAL
  Filled 2023-08-07: qty 1

## 2023-08-07 MED ORDER — ACETAMINOPHEN 325 MG PO TABS
650.0000 mg | ORAL_TABLET | ORAL | Status: DC | PRN
  Administered 2023-08-07: 650 mg via ORAL
  Filled 2023-08-07: qty 2

## 2023-08-07 NOTE — ED Provider Notes (Addendum)
Emergency Medicine Observation Re-evaluation Note  Lacey Perkins is a 67 y.o. female, seen on rounds today.  Pt initially presented to the ED for complaints of Psychiatric Evaluation Currently, the patient is asleep, resting.  Physical Exam  BP (!) 142/76   Pulse 90   Temp 98.3 F (36.8 C) (Oral)   Resp 16   Ht 5\' 3"  (1.6 m)   Wt 53.5 kg   SpO2 99%   BMI 20.90 kg/m  Physical Exam General: NAD Cardiac: well perfused Lungs: even and unlabored Psych: no agitation  ED Course / MDM  EKG:   I have reviewed the labs performed to date as well as medications administered while in observation.  Recent changes in the last 24 hours include seen by [facility] telepsych, recommended inpatient admission.  Added on SSI insulin coverage after talking with the diabetes coordinator. CBG 336 this AM.  Plan  Current plan is for SW to coordinate inpatient admission.        Ernie Avena, MD 08/07/23 [phone]

## 2023-08-07 NOTE — ED Notes (Signed)
[facility] called pts sister in law Advice worker) for collateral. 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 Since that time pt has deteriorated with her mental health and her ability to care for herself, manage her finances and home. Pts sister in law describes pt living in squalor with dog feces, food and trash strewn around. Pts sister in law reports that pt complains of respiratory issues that she believes are related possibly related to the condition of her house.   Per pts sister in law pt had not bathed for three weeks until the day before yesterday. Pts goes for long periods without bathing and when she does bathe uses dishwashing liquid. Pts sister in law has been cleaning pts house and has applied for guardianship to manage pts finances. Pt is in foreclosure on her house and stopped paying her bills last October.   Pts sister in law said that pt has been estranged from her children, not having contact with them for years at a time. Thirty years ago pt left her son (4 at the time) and her other children by choice and move to [place], [place]. Pts children were raised by their grandmother. Pts sister in law describes pt as being angry with her mother over the situation and rarely takes responsibility for the choices that she has made in her life. Per pts sister in law pt often "plays the victim".  Pts sister in law is a former Engineer, civil (consulting) who worked in the [employer]. She believes that along with MDD pt is bipolar and is undiagnosed. Pt was in the ED at [facility] this past July for a blood sugar issue. Pt reported a history of depression but she was not seen by a psych provider at the time. Pts sister in law would like to come to the ED to have pt sign paperwork making her pts guardian. Pts sister would like to be notified of pts disposition.   Jacquelynn Cree, [facility]  08/07/23

## 2023-08-07 NOTE — ED Notes (Signed)
Nurse from [facility] called to inquire about placement for patient. Nurse is going initiate admission and consult w/ physician at facility. Stated they will call nurse back after consult.

## 2023-08-07 NOTE — Progress Notes (Addendum)
Patient has been denied by [facility] due to no appropriate beds available. Patient meets [facility] inpatient criteria per Dr. Jerene Dilling. Patient has been faxed out to the following facilities:   [facility] [address], [place] [address] [phone]  [facility] [address], [place] [address] [phone]  [facility] Patient Placement [facility], [place] [phone]  [facility] [address] [place], [place] [address] [phone]  [facility] [address] [place] [address] [phone]  [facility] [address], [place] [address] [phone]  [facility] EFAX [address] [place], [place] [phone]  [facility] [address], [place] [address] [phone]  [facility] [address], [place] [address] [phone]  [facility] [address] [place] [address] [phone]  [facility] [address] [place] [address] [phone]  [facility] [address] [place] [address] [phone]  [facility] [address] [place] [address] [phone]  [facility] [place] [address] [phone]  [facility] [address] [place], [place] [address] [phone]  [facility] [address] [place] [address] [phone]  [facility] [address] [place] [address] [phone]  [facility] [address] [place]  [place] [address] [phone]   Damita Dunnings, MSW, LCSW-A  9:33 PM 08/07/2023

## 2023-08-07 NOTE — ED Notes (Signed)
Noted, pt did not received 70/30 at scheduled time of 08:00 today. Consulted with pharmacist and they advised to go ahead and given missed dose now and re reassess the need for the evening dose at 17:00 depending on blood glucose at that time.

## 2023-08-07 NOTE — ED Notes (Signed)
Pt moved to room for TTS. EMT-P connected pt to call.

## 2023-08-07 NOTE — Progress Notes (Signed)
[facility] Consult Note  Patient Name: Lacey Perkins MRN: [id number] DOB: 1955/09/15 DATE OF Consult: 08/07/2023  PRIMARY PSYCHIATRIC DIAGNOSES  1.  Major depressive disorder, revere, recurrent, without psychotic features.    RECOMMENDATIONS  Recommendations: Medication recommendations: Continue insulin. Patient was previously on Lexapro but stopped due to lack of efficacy. Stopped ADHD medication. Consider an SNRI next   Non-Medication/therapeutic recommendations: Inpatient admission followed by referral to robust outpatient services. Consider referral to case management as well Is inpatient psychiatric hospitalization recommended for this patient? Yes (Explain why): patient is passively suicidal, not taking care of self, not taking medications, fading away Follow-Up Telepsychiatry C/L services: We will sign off for now. Please re-consult our service if needed for any concerning changes in the patient's condition, discharge planning, or questions.  Thank you for involving [place] in the care of this patient. If you have any additional questions or concerns, please call [phone] and ask for me or the provider on-call.  TELEPSYCHIATRY ATTESTATION & CONSENT  As the provider for this telehealth consult, I attest that I verified the patient's identity using two separate identifiers, introduced myself to the patient, provided my credentials, disclosed my location, and performed this encounter via a HIPAA-compliant, real-time, face-to-face, two-way, interactive audio and video platform and with the full consent and agreement of the patient (or guardian as applicable.)  Patient physical location: [facility] Emergency Department at [facility] . Telehealth provider physical location: home office in state of [place].  Video start time: 4:20 am (Central Time) Video end time: 4:50 am  (Central Time)  IDENTIFYING DATA  Lacey Perkins is a 67 y.o. year-old female for whom  a psychiatric consultation has been ordered by the primary provider. The patient was identified using two separate identifiers.  CHIEF COMPLAINT/REASON FOR CONSULT  Depression   HISTORY OF PRESENT ILLNESS (HPI)  The patient is a 67 yo female with past history of depression and ADHD. She was brought in by her mom due to self neglect.  Patient reports that she has been feeling bad for about a year now.  He has been worse since May or June.  She has had depressive episodes before but says she always bounce back, nothing is but has this.  She used to be on Lexapro as well as Adderall but felt that none of them helped so she stopped taking it earlier this year, around February.  She also stopped taking her insulin at some point, now takes it sporadically when she remembers them.  What keeps her going is her dog, 24-year-old [place] who she has to walk every day.  She does have a neighbor then notices when she is not leaving the house and comes in to check on her.  She has 3 kids, 1 of which is nearby.  4 grandsons but they are out of town.  She says she sleeps poorly at night, does not sleep during the day.  Sometimes she will listen to a book on her phone, watch TV or play a game on her tablets.  She does not have active suicidal ideation meaning no plan to hurt herself, but also does not care, does not think anything will get better, does not have the energy or motivation. She does not follow up with outpatient appointments at this point.  We discussed inpatient admission to get her on the right medications and feeling better and she is agreeable. Marland Kitchen  PAST PSYCHIATRIC HISTORY   Otherwise as per HPI above.  PAST MEDICAL HISTORY  Past Medical History:  Diagnosis Date   ADD (attention deficit disorder)    Breast cancer (HCC)    Chest pain    Depression    Diabetes mellitus without complication (HCC)    Dyslipidemia    Kidney stone    Sleep disturbance      HOME MEDICATIONS  Facility Ordered  Medications  Medication   [COMPLETED] insulin aspart protamine- aspart (NOVOLOG MIX 70/30) injection 10 Units   insulin aspart protamine- aspart (NOVOLOG MIX 70/30) injection 10 Units   PTA Medications  Medication Sig   insulin aspart protamine- aspart (NOVOLOG MIX 70/30) (70-30) 100 UNIT/ML injection Inject 0.1 mLs (10 Units total) into the skin 2 (two) times daily with a meal. (Patient not taking: Reported on 08/06/2023)   Insulin Syringes, Disposable, (B-D INSULIN SYRINGE 1CC) U-100 1 ML MISC 1 each by Does not apply route in the morning and at bedtime.     ALLERGIES  Allergies  Allergen Reactions   Morphine     REACTION: itching   Penicillins     REACTION: hives   Sulfonamide Derivatives     REACTION: itching, hives, looses eye sight    SOCIAL & SUBSTANCE USE HISTORY  Social History   Socioeconomic History   Marital status: Divorced    Spouse name: Not on file   Number of children: Not on file   Years of education: Not on file   Highest education level: Not on file  Occupational History   Not on file  Tobacco Use   Smoking status: Every Day    Current packs/day: 1.00    Types: Cigarettes   Smokeless tobacco: Not on file  Substance and Sexual Activity   Alcohol use: Yes    Alcohol/week: 0.0 standard drinks of alcohol    Comment: socially   Drug use: No   Sexual activity: Not on file  Other Topics Concern   Not on file  Social History Narrative   Not on file   Social Drivers of Health   Financial Resource Strain: Not on file  Food Insecurity: Not on file  Transportation Needs: Not on file  Physical Activity: Not on file  Stress: Not on file  Social Connections: Not on file   Social History   Tobacco Use  Smoking Status Every Day   Current packs/day: 1.00   Types: Cigarettes  Smokeless Tobacco Not on file   Social History   Substance and Sexual Activity  Alcohol Use Yes   Alcohol/week: 0.0 standard drinks of alcohol   Comment: socially   Social  History   Substance and Sexual Activity  Drug Use No   .  FAMILY HISTORY  Family History  Problem Relation Age of Onset   Hypertension Mother    Diabetes Mother    Hypertension Father      MENTAL STATUS EXAM (MSE)  Mental Status Exam: General Appearance: Casual  Orientation:  Full (Time, Place, and Person)  Memory:  Immediate;   Fair Recent;   Fair  Concentration:  Concentration: Fair  Recall:  Fair  Attention  Fair  Eye Contact:  Good  Speech:  Normal Rate  Language:  Good  Volume:  Normal  Mood: depressed  Affect:  Appropriate  Thought Process:  Linear  Thought Content:  Logical  Suicidal Thoughts:  Yes.  without intent/plan  Homicidal Thoughts:  No  Judgement:  Impaired  Insight:  Lacking  Psychomotor Activity:  Normal  Akathisia:  No  Fund of Knowledge:  Good  Assets:  Housing  Cognition:  WNL  ADL's:  Intact  AIMS (if indicated):       VITALS  Blood pressure (!) 142/76, pulse 90, temperature 98.3 F (36.8 C), temperature source Oral, resp. rate 16, height 5\' 3"  (1.6 m), weight 53.5 kg, SpO2 99%.  LABS  Admission on 08/06/2023  Component Date Value Ref Range Status   Sodium 08/06/2023 135  135 - 145 mmol/L Final   Potassium 08/06/2023 4.5  3.5 - 5.1 mmol/L Final   Chloride 08/06/2023 99  98 - 111 mmol/L Final   CO2 08/06/2023 23  22 - 32 mmol/L Final   Glucose, Bld 08/06/2023 389 (H)  70 - 99 mg/dL Final   Glucose reference range applies only to samples taken after fasting for at least 8 hours.   BUN 08/06/2023 17  8 - 23 mg/dL Final   Creatinine, Ser 08/06/2023 0.46  0.44 - 1.00 mg/dL Final   Calcium 40/98/1191 10.5 (H)  8.9 - 10.3 mg/dL Final   Total Protein 47/82/9562 8.5 (H)  6.5 - 8.1 g/dL Final   Albumin 13/03/6577 4.7  3.5 - 5.0 g/dL Final   AST 46/96/2952 19  15 - 41 U/L Final   ALT 08/06/2023 16  0 - 44 U/L Final   Alkaline Phosphatase 08/06/2023 124  38 - 126 U/L Final   Total Bilirubin 08/06/2023 1.1  <1.2 mg/dL Final   GFR, Estimated  08/06/2023 >60  >60 mL/min Final   Comment: (NOTE) Calculated using the CKD-EPI Creatinine Equation (2021)    Anion gap 08/06/2023 13  5 - 15 Final   Performed at [facility], [address] [place], [place] [address]   Alcohol, Ethyl (B) 08/06/2023 <10  <10 mg/dL Final   Comment: (NOTE) Lowest detectable limit for serum alcohol is 10 mg/dL.  For medical purposes only. Performed at [facility], [address] [place], [place] [address]    Salicylate Lvl 08/06/2023 <7.0 (L)  7.0 - 30.0 mg/dL Final   Performed at [facility], [address] [place], [place] [address]   Acetaminophen (Tylenol), Serum 08/06/2023 <10 (L)  10 - 30 ug/mL Final   Comment: (NOTE) Therapeutic concentrations vary significantly. A range of 10-30 ug/mL  may be an effective concentration for many patients. However, some  are best treated at concentrations outside of this range. Acetaminophen concentrations >150 ug/mL at 4 hours after ingestion  and >50 ug/mL at 12 hours after ingestion are often associated with  toxic reactions.  Performed at [facility], [address] [place], [place] [address]    WBC 08/06/2023 8.4  4.0 - 10.5 K/uL Final   RBC 08/06/2023 5.74 (H)  3.87 - 5.11 MIL/uL Final   Hemoglobin 08/06/2023 17.2 (H)  12.0 - 15.0 g/dL Final   HCT 34/74/2595 48.7 (H)  36.0 - 46.0 % Final   MCV 08/06/2023 84.8  80.0 - 100.0 fL Final   MCH 08/06/2023 30.0  26.0 - 34.0 pg Final   MCHC 08/06/2023 35.3  30.0 - 36.0 g/dL Final   RDW 63/87/5643 11.9  11.5 - 15.5 % Final   Platelets 08/06/2023 366  150 - 400 K/uL Final   nRBC 08/06/2023 0.0  0.0 - 0.2 % Final   Performed at [facility], [address] [place], [place] [address]   Opiates 08/06/2023 NONE DETECTED  NONE DETECTED Final   Cocaine 08/06/2023 NONE DETECTED  NONE DETECTED Final   Benzodiazepines 08/06/2023  NONE DETECTED  NONE DETECTED Final    Amphetamines 08/06/2023 NONE DETECTED  NONE DETECTED Final   Tetrahydrocannabinol 08/06/2023 NONE DETECTED  NONE DETECTED Final   Barbiturates 08/06/2023 NONE DETECTED  NONE DETECTED Final   Comment: (NOTE) DRUG SCREEN FOR MEDICAL PURPOSES ONLY.  IF CONFIRMATION IS NEEDED FOR ANY PURPOSE, NOTIFY LAB WITHIN 5 DAYS.  LOWEST DETECTABLE LIMITS FOR URINE DRUG SCREEN Drug Class                     Cutoff (ng/mL) Amphetamine and metabolites    1000 Barbiturate and metabolites    200 Benzodiazepine                 200 Opiates and metabolites        300 Cocaine and metabolites        300 THC                            50 Performed at [facility], [address], [place], [place] [address]    Glucose-Capillary 08/06/2023 222 (H)  70 - 99 mg/dL Final   Glucose reference range applies only to samples taken after fasting for at least 8 hours.    PSYCHIATRIC REVIEW OF SYSTEMS (ROS)  ROS: Notable for the following relevant positive findings: ROS  Additional findings:      Musculoskeletal: No abnormal movements observed      Gait & Station: Laying/Sitting      Pain Screening: Denies      Nutrition & Dental Concerns: Decrease in food intake and/or loss of appetite  RISK FORMULATION/ASSESSMENT  Is the patient experiencing any suicidal or homicidal ideations: Yes       Explain if yes: passive SI, no active plan other than active self neglect  Protective factors considered for safety management: caring neighbors and family   Risk factors/concerns considered for safety management:  Depression  Is there a safety management plan with the patient and treatment team to minimize risk factors and promote protective factors: Yes           Explain: inpatient admission  Is crisis care placement or psychiatric hospitalization recommended: Yes     Based on my current evaluation and risk assessment, patient is determined at this time to be at:  Moderate Risk  *RISK ASSESSMENT Risk  assessment is a dynamic process; it is possible that this patient's condition, and risk level, may change. This should be re-evaluated and managed over time as appropriate. Please re-consult psychiatric consult services if additional assistance is needed in terms of risk assessment and management. If your team decides to discharge this patient, please advise the patient how to best access emergency psychiatric services, or to call 911, if their condition worsens or they feel unsafe in any way.   Dian Situ, MD Telepsychiatry Consult ServicesPatient ID: Weldon Inches, female   DOB: 09/19/1955, 68 y.o.   MRN: [id number]

## 2023-08-08 LAB — HEMOGLOBIN A1C
Hgb A1c MFr Bld: 12.5 % — ABNORMAL HIGH (ref 4.8–5.6)
Mean Plasma Glucose: 312 mg/dL

## 2023-08-08 LAB — CBG MONITORING, ED: Glucose-Capillary: 284 mg/dL — ABNORMAL HIGH (ref 70–99)

## 2023-08-08 NOTE — ED Notes (Signed)
RN called [facility] and gave report to [place], RN. Safe transport called to take patient to [address] [place], [place] [address]

## 2023-08-08 NOTE — ED Notes (Signed)
Deferred vitals until patient wakes up per patient's request.

## 2023-08-08 NOTE — ED Notes (Signed)
This RN contacted receiving nurse at [facility] to give report on patient. Receiving nurse asked to receive call in morning when patient is en route.

## 2023-08-08 NOTE — ED Notes (Signed)
RN gave patient clothing to wear to facility she states all personal belongings were taken home by her sister.

## 2023-08-08 NOTE — ED Notes (Addendum)
Nurse from [facility] returned call and stated that pt was accepted. Pt's bed is ready and per charge RN can be transferred in morning. Receiving nurse can be contacted at this number [phone].

## 2023-08-08 NOTE — ED Provider Notes (Signed)
Emergency Medicine Observation Re-evaluation Note  Lacey Perkins is a 67 y.o. female, seen on rounds today.  Pt initially presented to the ED for complaints of Psychiatric Evaluation Currently, the patient is resting comfortably in the hallway DC today.  Physical Exam  BP 110/84 (BP Location: Left Arm)   Pulse 99   Temp 98.6 F (37 C) (Oral)   Resp 18   Ht 1.6 m (5\' 3" )   Wt 53.5 kg   SpO2 96%   BMI 20.90 kg/m  Physical Exam  ED Course / MDM  EKG:   I have reviewed the labs performed to date as well as medications administered while in observation.  Recent changes in the last 24 hours include none.  Plan  Current plan is for transfer to inpatient psych facility.    Lorre Nick, MD 08/08/23 [phone]

## 2023-08-25 ENCOUNTER — Ambulatory Visit (INDEPENDENT_AMBULATORY_CARE_PROVIDER_SITE_OTHER): Payer: Self-pay | Admitting: Physician Assistant

## 2023-08-25 VITALS — BP 152/90 | HR 81 | Temp 97.8°F | Ht 63.0 in | Wt 130.8 lb

## 2023-08-25 DIAGNOSIS — F411 Generalized anxiety disorder: Secondary | ICD-10-CM

## 2023-08-25 DIAGNOSIS — F331 Major depressive disorder, recurrent, moderate: Secondary | ICD-10-CM

## 2023-08-25 DIAGNOSIS — F99 Mental disorder, not otherwise specified: Secondary | ICD-10-CM

## 2023-08-25 DIAGNOSIS — Z8659 Personal history of other mental and behavioral disorders: Secondary | ICD-10-CM

## 2023-08-25 DIAGNOSIS — F5105 Insomnia due to other mental disorder: Secondary | ICD-10-CM

## 2023-08-25 MED ORDER — BUSPIRONE HCL 7.5 MG PO TABS: 7.5000 mg | ORAL_TABLET | Freq: Two times a day (BID) | ORAL | 0 refills | Status: DC

## 2023-08-25 MED ORDER — HYDROXYZINE HCL 10 MG PO TABS: 10.0000 mg | ORAL_TABLET | Freq: Three times a day (TID) | ORAL | 0 refills | Status: DC | PRN

## 2023-08-25 MED ORDER — DOXEPIN HCL 10 MG PO CAPS: 10.0000 mg | ORAL_CAPSULE | Freq: Every day | ORAL | 0 refills | Status: DC

## 2023-08-25 MED ORDER — ESCITALOPRAM OXALATE 10 MG PO TABS: 10.0000 mg | ORAL_TABLET | Freq: Every day | ORAL | 0 refills | Status: DC

## 2023-08-25 NOTE — Progress Notes (Signed)
 Psychiatric Initial Adult Assessment   Patient Identification: Lacey Perkins MRN:  [id number] Date of Evaluation:  08/25/2023 Referral Source: Walk-in Chief Complaint:   Chief Complaint  Patient presents with   Establish Care   Medication Management   Visit Diagnosis:    ICD-10-CM   1. Moderate episode of recurrent major depressive disorder (HCC)  F33.1 escitalopram  (LEXAPRO ) 10 MG tablet    2. Generalized anxiety disorder  F41.1 busPIRone  (BUSPAR ) 7.5 MG tablet    escitalopram  (LEXAPRO ) 10 MG tablet    hydrOXYzine  (ATARAX ) 10 MG tablet    3. Insomnia due to other mental disorder  F51.05 doxepin  (SINEQUAN ) 10 MG capsule   F99     4. History of ADHD  Z86.59       History of Present Illness:    Lacey Perkins is a 68 year old female with a past psychiatric history significant for depression and ADD who presents to [facility] as a walk-in for medication management.  Patient presents to the encounter for medication management.  She reports that she was recently hospitalized at [facility] ED the Sunday before Christmas due to her severe depression.  After being assessed at [facility] ED, patient was transferred to [facility] in [place], [place]  for inpatient psychiatry.  During her hospitalization, patient was placed back on Lexapro  10 mg daily.  Patient was also placed on trazodone  100 mg at bedtime.  Patient reports that her preferred medications are Klonopin and has a history of being on Adderall and Ambien .  In regards to her sleep, patient reports that the Ambien  works better for managing her sleep.  Prior to her hospitalization at [facility], patient reports that she has been struggling with depression for 4 years after losing her job.  She reports that she tapered off her previous psychiatric medications for a year and then experienced severe depression 6 months later.  Patient reports that  she was taking Lexapro  before discontinuing the medication.  Patient continues to endorse some depression and rates her depression a 4 out of 10 with 10 being most severe.  She reports that she has been feeling much better since starting her medication.  She endorses depressive episodes every day but states that her symptoms are not as severe as before.  Patient reports that she would barely leave her home during her severe depression.  Patient endorses the following depressive symptoms: feelings of sadness, lack of motivation, decreased concentration, irritability, decreased energy, feelings of guilt/worthlessness, and hopelessness. Before being hospitalized, patient states that she would ask for God to take her away.  In addition to depression, patient endorses anxiety and rates her anxiety an 8-10 out of 10.  Patient reports that she often freezes when she is experiencing anxiety as if experiencing a low electrical surge.  Patient reports that she has been experiencing issues with her sleep as well.  Though she was discharged with trazodone  100 mg at bedtime, patient reports that the medication has not been helpful in managing her sleep.  She reports that last night, she took 2 doses of her trazodone  and was still unable to sleep.  Besides her most recent stay at [facility], patient denies a past history of hospitalization due to mental health.  Patient endorses a past history of suicide attempt but states that she attempted long ago.  Patient denies a past history of homicide attempt.  A PHQ-9 screen was performed with the patient scoring a 15.  A GAD-7  screen was also performed with the patient scoring a 19.  Patient is alert and oriented x 4, calm, cooperative, and fully engaged in conversation during the encounter.  Patient describes her mood as hopeful.  Patient exhibits depressed and anxious mood with appropriate affect.  Patient denies suicidal or homicidal ideations.  She further  denies auditory or visual hallucinations and does not appear to be responding to internal/external stimuli.  Patient denies paranoia or delusional thoughts.  Patient endorses poor sleep.  Patient endorses fair appetite and eats on average 2 meals per day.  Patient endorses alcohol consumption sparingly.  Patient endorses tobacco use and smokes on average a pack per day.  Patient denies illicit drug use.  Associated Signs/Symptoms: Depression Symptoms:  depressed mood, anhedonia, insomnia, psychomotor agitation, psychomotor retardation, fatigue, feelings of worthlessness/guilt, difficulty concentrating, hopelessness, impaired memory, recurrent thoughts of death, anxiety, panic attacks, loss of energy/fatigue, disturbed sleep, weight loss, weight gain, (Hypo) Manic Symptoms:  Distractibility, Flight of Ideas, Licensed Conveyancer, Impulsivity, Irritable Mood, Labiality of Mood, Anxiety Symptoms:  Agoraphobia, Excessive Worry, Panic Symptoms, Social Anxiety, Psychotic Symptoms:  Paranoia, PTSD Symptoms: Had a traumatic exposure:  Patient reports that she has had breakups that were impactful. Patient reports that she had breast cancer in the past. Patient reports she lost her job. Had a traumatic exposure in the last month:  N/A Re-experiencing:  Flashbacks Intrusive Thoughts Nightmares Hypervigilance:  Yes Hyperarousal:  Difficulty Concentrating Emotional Numbness/Detachment Increased Startle Response Irritability/Anger Sleep Avoidance:  Decreased Interest/Participation Foreshortened Future  Past Psychiatric History:  Patient endorses a past history of depression and ADD.  Besides her most recent hospitalization, patient denies a past history of hospitalization due to mental health. -Patient reports that she was hospitalized at [facility] ED the Sunday before Christmas due to worsening depression -After being assessed at [facility], patient was transferred to [facility] for mental health.  Patient reports that she has attempted suicide several years ago.  Patient denies a past history of homicide attempt  Previous Psychotropic Medications: Yes   Substance Abuse History in the last 12 months:  No.  Consequences of Substance Abuse: NA  Past Medical History:  Past Medical History:  Diagnosis Date   ADD (attention deficit disorder)    Breast cancer (HCC)    Chest pain    Depression    Diabetes mellitus without complication (HCC)    Dyslipidemia    Kidney stone    Sleep disturbance     Past Surgical History:  Procedure Laterality Date   MASTECTOMY  1999   Reconstructive Breast Surgery  2000,2010   TONSILLECTOMY  1974    Family Psychiatric History:  Patient denies a family history of mental illness  Family history of suicide attempt: Patient denies Family history of homicide attempt: Patient denies Family history of substance abuse: Patient reports that her brother is a functioning alcoholic  Family History:  Family History  Problem Relation Age of Onset   Hypertension Mother    Diabetes Mother    Hypertension Father     Social History:   Social History   Socioeconomic History   Marital status: Divorced    Spouse name: Not on file   Number of children: Not on file   Years of education: Not on file   Highest education level: Not on file  Occupational History   Not on file  Tobacco Use   Smoking status: Every Day    Current packs/day: 1.00    Types: Cigarettes  Smokeless tobacco: Not on file  Substance and Sexual Activity   Alcohol use: Yes    Alcohol/week: 0.0 standard drinks of alcohol    Comment: socially   Drug use: No   Sexual activity: Not on file  Other Topics Concern   Not on file  Social History Narrative   Not on file   Social Drivers of Health   Financial Resource Strain: Not on file  Food Insecurity: Not on file  Transportation Needs: Not on file  Physical Activity: Not on file   Stress: Not on file  Social Connections: Not on file    Additional Social History:  Patient endorses social support. Patient endorses having children of her own. Patient endorses housing. Patient denies being employed. Patient denies a past history of military experience. Patient denies a past history of prison or jail time. Highest education earned by the patient is 4 years of college. Patient denies access to weapons.  Allergies:   Allergies  Allergen Reactions   Morphine     REACTION: itching   Penicillins     REACTION: hives   Sulfonamide Derivatives     REACTION: itching, hives, looses eye sight    Metabolic Disorder Labs: Lab Results  Component Value Date   HGBA1C 12.5 (H) 08/07/2023   MPG 312 08/07/2023   No results found for: PROLACTIN No results found for: CHOL, TRIG, HDL, CHOLHDL, VLDL, LDLCALC No results found for: TSH  Therapeutic Level Labs: No results found for: LITHIUM No results found for: CBMZ No results found for: VALPROATE  Current Medications: Current Outpatient Medications  Medication Sig Dispense Refill   busPIRone  (BUSPAR ) 7.5 MG tablet Take 1 tablet (7.5 mg total) by mouth 2 (two) times daily. 60 tablet 0   doxepin  (SINEQUAN ) 10 MG capsule Take 1 capsule (10 mg total) by mouth at bedtime. 30 capsule 0   escitalopram  (LEXAPRO ) 10 MG tablet Take 1 tablet (10 mg total) by mouth daily. 30 tablet 0   hydrOXYzine  (ATARAX ) 10 MG tablet Take 1 tablet (10 mg total) by mouth 3 (three) times daily as needed. 75 tablet 0   ibuprofen (ADVIL) 200 MG tablet Take 400-600 mg by mouth every 6 (six) hours as needed for mild pain (pain score 1-3) or moderate pain (pain score 4-6).     insulin  aspart protamine- aspart (NOVOLOG  MIX 70/30) (70-30) 100 UNIT/ML injection Inject 0.1 mLs (10 Units total) into the skin 2 (two) times daily with a meal. (Patient not taking: Reported on 08/06/2023) 10 mL 11   Insulin  Syringes, Disposable, (B-D INSULIN   SYRINGE 1CC) U-100 1 ML MISC 1 each by Does not apply route in the morning and at bedtime. 100 each 0   No current facility-administered medications for this visit.    Musculoskeletal: Strength & Muscle Tone: within normal limits Gait & Station: normal Patient leans: N/A  Psychiatric Specialty Exam: Review of Systems  Psychiatric/Behavioral:  Positive for decreased concentration and sleep disturbance. Negative for dysphoric mood, hallucinations, self-injury and suicidal ideas. The patient is nervous/anxious. The patient is not hyperactive.     Blood pressure (!) 177/90, pulse 81, temperature 97.8 F (36.6 C), temperature source Oral, height 5' 3 (1.6 m), weight 130 lb 12.8 oz (59.3 kg), SpO2 97%.Body mass index is 23.17 kg/m.  General Appearance: Casual  Eye Contact:  Good  Speech:  Clear and Coherent and Normal Rate  Volume:  Normal  Mood:  Anxious  Affect:  Appropriate  Thought Process:  Coherent, Goal Directed, and Descriptions  of Associations: Intact  Orientation:  Full (Time, Place, and Person)  Thought Content:  WDL  Suicidal Thoughts:  No  Homicidal Thoughts:  No  Memory:  Immediate;   Good Recent;   Good Remote;   Good  Judgement:  Good  Insight:  Good  Psychomotor Activity:  Normal  Concentration:  Concentration: Good and Attention Span: Good  Recall:  Good  Fund of Knowledge:Good  Language: Good  Akathisia:  No  Handed:  Right  AIMS (if indicated):  not done  Assets:  Communication Skills Desire for Improvement Housing Physical Health Social Support  ADL's:  Intact  Cognition: WNL  Sleep:  Poor   Screenings: GAD-7    Flowsheet Row Office Visit from 08/25/2023 in [facility]  Total GAD-7 Score 19      PHQ2-9    Flowsheet Row Office Visit from 08/25/2023 in [facility]  PHQ-2 Total Score 3  PHQ-9 Total Score 15      Flowsheet Row Office Visit from 08/25/2023 in [facility] ED from 08/06/2023 in [facility] at [facility] ED from 02/23/2023 in [facility] Emergency Department at [facility]  C-SSRS RISK CATEGORY Low Risk Low Risk No Risk       Assessment and Plan:   Lacey Perkins is a 68 year old female with a past psychiatric history significant for depression and ADD who presents to [facility] as a walk-in for medication management.  Patient presents to the encounter requesting medication management following discharge from [facility].  Patient was admitted to [facility] shortly after being assessed at [facility] ED the Sunday before Christmas.  Patient was placed back on Lexapro  10 mg daily as well as trazodone  100 mg at bedtime for the management of her depressive symptoms, anxiety, and sleep.  Since being placed back on Lexapro , patient states that her depression has improved; however, she still continues to endorse some depression on occasion.  Patient reports that her anxiety is still elevated and has been experiencing poor sleep.  Patient reports that she tried taking 2 doses of her trazodone  the night before and still was not able to sleep.  Provider recommended patient discontinue her use of trazodone  in favor of doxepin  10 mg daily for the management of her sleep.  Patient was also recommended buspirone  7.5 mg 2 times daily and hydroxyzine  10 mg 3 times daily as needed for the management of her anxiety.  Patient was agreeable to recommendations.  Patient's medications to be e-prescribed to pharmacy of choice.  Patient has a primary care provider that she will be seeing shortly.  Due to patient's insurance, patient will be transferred over to [facility]. Collaboration of Care: Medication Management AEB Provider managing patient's psychiatric medications, Primary Care Provider AEB patient switching over to a new provider, and  Psychiatrist AEB patient being followed by a psychiatric provider  Patient/Guardian was advised Release of Information must be obtained prior to any record release in order to collaborate their care with an outside provider. Patient/Guardian was advised if they have not already done so to contact the registration department to sign all necessary forms in order for us  to release information regarding their care.   Consent: Patient/Guardian gives verbal consent for treatment and assignment of benefits for services provided during this visit. Patient/Guardian expressed understanding and agreed to proceed.   1. Moderate episode of recurrent major depressive disorder (  HCC) (Primary)  - escitalopram  (LEXAPRO ) 10 MG tablet; Take 1 tablet (10 mg total) by mouth daily.  Dispense: 30 tablet; Refill: 0  2. Generalized anxiety disorder  - busPIRone  (BUSPAR ) 7.5 MG tablet; Take 1 tablet (7.5 mg total) by mouth 2 (two) times daily.  Dispense: 60 tablet; Refill: 0 - escitalopram  (LEXAPRO ) 10 MG tablet; Take 1 tablet (10 mg total) by mouth daily.  Dispense: 30 tablet; Refill: 0 - hydrOXYzine  (ATARAX ) 10 MG tablet; Take 1 tablet (10 mg total) by mouth 3 (three) times daily as needed.  Dispense: 75 tablet; Refill: 0  3. Insomnia due to other mental disorder  - doxepin  (SINEQUAN ) 10 MG capsule; Take 1 capsule (10 mg total) by mouth at bedtime.  Dispense: 30 capsule; Refill: 0  4. History of ADHD  Patient is being transferred to the [facility] facility following the conclusion of the encounter Provider spent a total of 55 minutes with the patient/reviewing the patient's chart  Reginia FORBES Bolster, PA 1/10/20259:44 AM

## 2023-08-26 ENCOUNTER — Telehealth (HOSPITAL_COMMUNITY): Payer: Self-pay | Admitting: Psychiatry

## 2023-08-28 ENCOUNTER — Encounter (HOSPITAL_COMMUNITY): Payer: Self-pay | Admitting: Physician Assistant

## 2023-09-18 ENCOUNTER — Telehealth (HOSPITAL_BASED_OUTPATIENT_CLINIC_OR_DEPARTMENT_OTHER): Payer: Self-pay | Admitting: Psychiatry

## 2023-09-18 ENCOUNTER — Encounter (HOSPITAL_COMMUNITY): Payer: Self-pay | Admitting: Psychiatry

## 2023-09-18 VITALS — Wt 130.0 lb

## 2023-09-18 DIAGNOSIS — F411 Generalized anxiety disorder: Secondary | ICD-10-CM

## 2023-09-18 DIAGNOSIS — F331 Major depressive disorder, recurrent, moderate: Secondary | ICD-10-CM

## 2023-09-18 DIAGNOSIS — Z8659 Personal history of other mental and behavioral disorders: Secondary | ICD-10-CM

## 2023-09-18 DIAGNOSIS — F5105 Insomnia due to other mental disorder: Secondary | ICD-10-CM

## 2023-09-18 MED ORDER — BUPROPION HCL ER (XL) 150 MG PO TB24: 150.0000 mg | ORAL_TABLET | Freq: Every day | ORAL | 0 refills | Status: DC

## 2023-09-18 MED ORDER — HYDROXYZINE HCL 10 MG PO TABS: 10.0000 mg | ORAL_TABLET | Freq: Two times a day (BID) | ORAL | 0 refills | Status: DC | PRN

## 2023-09-18 MED ORDER — ESCITALOPRAM OXALATE 20 MG PO TABS: 20.0000 mg | ORAL_TABLET | Freq: Every day | ORAL | 0 refills | Status: DC

## 2023-09-18 NOTE — Progress Notes (Addendum)
 [facility] MD Virtual Progress Note   Patient Location: Home Provider Location: Home Office  I connect with patient by video and verified that I am speaking with correct person by using two identifiers. I discussed the limitations of evaluation and management by telemedicine and the availability of in person appointments. I also discussed with the patient that there may be a patient responsible charge related to this service. The patient expressed understanding and agreed to proceed.  Arika Mainer [id number] 68 y.o.  09/18/2023 1:24 PM  History of Present Illness:  Patient is 68 year old Caucasian, divorced, unemployed female who is referred from [facility] behavioral health outpatient clinic.  Due to her insurance she not able to continue care there and she was referred to see psychiatrist in our office.  Patient was admitted at [facility] in December after she was evaluated at [facility] emergency room for severe depression.  Patient told she stopped taking her insulin and psychotropic medication for a while and started to decompensate slowly.  She remember neglecting herself hygiene, excessive weight gain, not leaving the house, having paranoia, passive suicidal thoughts and crying spells.  Her sister-in-law Zella Ball decided to get patient help and brought to the hospital.  Patient stayed at [facility] for 2 weeks.  She was restarted on Lexapro, hydroxyzine, BuSpar, doxepin.  Patient continue all these medication upon discharge from the per [facility] behavioral health.  Patient reported things are slowly and gradually getting better but still struggle with motivation, energy level and anxiety.  She started walking her dog but is still does not go outside for any other reason.  Her sister-in-law Zella Ball helped her a lot.  Patient told she is not dealing with the finances, bringing groceries because she does not have desire to do things.  She like to go  back on her Adderall which was given by her previous psychiatrist Oneta Rack.  Patient told she saw Donnie Aho at [facility] counseling for many years and prescribed Wellbutrin, Lexapro and Adderall.  When asked why she stopped going there patient told feeling embarrassed to go back to same place again.  She denies any active suicidal thoughts or crying spells and slowly and gradually her sleep is better and her appetite is better.  She walks every day for dog walking.  She has very limited social network.  She does talk to her neighbors.  Patient notes she has 3 sons and she does talk with them on occasions.  Patient told 2 of her son lives in [place] and 1 live in [place].  Patient told she is divorced for many years and had no contact with children father.  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  Patient told she never made any friends because she was at work most of the time.  She denies any hallucination, paranoia, mania, psychosis, anger.  So far reported no side effects other than she feels very tired, sleepy.  She is taking hydroxyzine prescribed 10 mg up to 3 times a day but she only take maximum twice a day, BuSpar 7.5 mg twice a day but she only take 1 pill at bedtime.  She takes Lexapro 10 mg and doxepin 10 mg at bedtime.  She has not engaged in any therapy but open to see a therapist to help with coping skills.  She started taking insulin and had appointment to see her primary care tomorrow.  Her endocrinologist is at [facility].  She reported her blood sugar readings are much better but is still run high in the morning.  She denies drinking or using any illegal substances.    Past Psychiatric History: History of ADD, depression, anxiety most of her life.  Saw Donnie Aho at [place] counseling for more than 20 years.  Remember taking Ambien, Wellbutrin, Klonopin, trazodone and Adderall.  No history of suicidal  attempt but history of inpatient at [facility] in December 2024.  History of break-ups that were significant and history of flashbacks, intrusive thoughts.  No history of psychosis.  Psychosocial history; Patient born and raised in [place].  She dropped out from college and started working as a Financial controller.  She worked for 24 years.  She married and has 3 sons.  Patient reported marriage falls apart.  Patient has limited social network.  Her brother lives close by and she is close to her sister-in-law.  She lives with her dog.  Family history of psychiatric illness; Patient denies family history of psychiatric illness.  Alcohol and substance use history; Patient denies any history of alcohol intoxication or illegal substance use.   Outpatient Encounter Medications as of 09/18/2023  Medication Sig   busPIRone (BUSPAR) 7.5 MG tablet Take 1 tablet (7.5 mg total) by mouth 2 (two) times daily.   doxepin (SINEQUAN) 10 MG capsule Take 1 capsule (10 mg total) by mouth at bedtime.   escitalopram (LEXAPRO) 10 MG tablet Take 1 tablet (10 mg total) by mouth daily.   hydrOXYzine (ATARAX) 10 MG tablet Take 1 tablet (10 mg total) by mouth 3 (three) times daily as needed.   ibuprofen (ADVIL) 200 MG tablet Take 400-600 mg by mouth every 6 (six) hours as needed for mild pain (pain score 1-3) or moderate pain (pain score 4-6).   insulin aspart protamine- aspart (NOVOLOG MIX 70/30) (70-30) 100 UNIT/ML injection Inject 0.1 mLs (10 Units total) into the skin 2 (two) times daily with a meal. (Patient not taking: Reported on 08/06/2023)   Insulin Syringes, Disposable, (B-D INSULIN SYRINGE 1CC) U-100 1 ML MISC 1 each by Does not apply route in the morning and at bedtime.   No facility-administered encounter medications on file as of 09/18/2023.    Recent Results (from the past 2160 hours)  Comprehensive metabolic panel     Status: Abnormal   Collection Time: 08/06/23 12:50 PM  Result Value Ref  Range   Sodium 135 135 - 145 mmol/L   Potassium 4.5 3.5 - 5.1 mmol/L   Chloride 99 98 - 111 mmol/L   CO2 23 22 - 32 mmol/L   Glucose, Bld 389 (H) 70 - 99 mg/dL    Comment: Glucose reference range applies only to samples taken after fasting for at least 8 hours.   BUN 17 8 - 23 mg/dL   Creatinine, Ser 1.61 0.44 - 1.00 mg/dL   Calcium 09.6 (H) 8.9 - 10.3 mg/dL   Total Protein 8.5 (H) 6.5 - 8.1 g/dL   Albumin 4.7 3.5 - 5.0 g/dL   AST 19 15 - 41 U/L   ALT 16 0 - 44 U/L   Alkaline Phosphatase 124 38 - 126 U/L   Total Bilirubin 1.1 <1.2 mg/dL   GFR, Estimated >04 >54 mL/min    Comment: (NOTE) Calculated using the CKD-EPI Creatinine Equation (2021)    Anion gap 13 5 - 15    Comment: Performed at [facility], [address] [place], [place] [address]  Ethanol  Status: None   Collection Time: 08/06/23 12:50 PM  Result Value Ref Range   Alcohol, Ethyl (B) <10 <10 mg/dL    Comment: (NOTE) Lowest detectable limit for serum alcohol is 10 mg/dL.  For medical purposes only. Performed at [facility], [address] [place], [place] [address]   Salicylate level     Status: Abnormal   Collection Time: 08/06/23 12:50 PM  Result Value Ref Range   Salicylate Lvl <7.0 (L) 7.0 - 30.0 mg/dL    Comment: Performed at [facility], [address], [place], [place] [address]  Acetaminophen level     Status: Abnormal   Collection Time: 08/06/23 12:50 PM  Result Value Ref Range   Acetaminophen (Tylenol), Serum <10 (L) 10 - 30 ug/mL    Comment: (NOTE) Therapeutic concentrations vary significantly. A range of 10-30 ug/mL  may be an effective concentration for many patients. However, some  are best treated at concentrations outside of this range. Acetaminophen concentrations >150 ug/mL at 4 hours after ingestion  and >50 ug/mL at 12 hours after ingestion are often associated with  toxic reactions.  Performed at [facility], [address] [place], [place] [address]   cbc     Status: Abnormal   Collection Time: 08/06/23 12:50 PM  Result Value Ref Range   WBC 8.4 4.0 - 10.5 K/uL   RBC 5.74 (H) 3.87 - 5.11 MIL/uL   Hemoglobin 17.2 (H) 12.0 - 15.0 g/dL   HCT 78.4 (H) 69.6 - 29.5 %   MCV 84.8 80.0 - 100.0 fL   MCH 30.0 26.0 - 34.0 pg   MCHC 35.3 30.0 - 36.0 g/dL   RDW 28.4 13.2 - 44.0 %   Platelets 366 150 - 400 K/uL   nRBC 0.0 0.0 - 0.2 %    Comment: Performed at [facility], [address], [place], [place] [address]  Rapid urine drug screen (hospital performed)     Status: None   Collection Time: 08/06/23  1:32 PM  Result Value Ref Range   Opiates NONE DETECTED NONE DETECTED   Cocaine NONE DETECTED NONE DETECTED   Benzodiazepines NONE DETECTED NONE DETECTED   Amphetamines NONE DETECTED NONE DETECTED   Tetrahydrocannabinol NONE DETECTED NONE DETECTED   Barbiturates NONE DETECTED NONE DETECTED    Comment: (NOTE) DRUG SCREEN FOR MEDICAL PURPOSES ONLY.  IF CONFIRMATION IS NEEDED FOR ANY PURPOSE, NOTIFY LAB WITHIN 5 DAYS.  LOWEST DETECTABLE LIMITS FOR URINE DRUG SCREEN Drug Class                     Cutoff (ng/mL) Amphetamine and metabolites    1000 Barbiturate and metabolites    200 Benzodiazepine                 200 Opiates and metabolites        300 Cocaine and metabolites        300 THC                            50 Performed at [facility], [address], [place], [place] [address]   POC CBG, ED     Status: Abnormal   Collection Time: 08/06/23  3:34 PM  Result Value Ref Range   Glucose-Capillary 222 (H) 70 - 99 mg/dL    Comment: Glucose reference range applies only to samples taken after fasting for at  least 8 hours.  CBG monitoring, ED     Status: Abnormal   Collection Time: 08/07/23  8:18 AM  Result Value Ref Range   Glucose-Capillary 336 (H) 70 - 99 mg/dL    Comment: Glucose reference range applies only to samples taken after  fasting for at least 8 hours.  CBG monitoring, ED     Status: Abnormal   Collection Time: 08/07/23 11:48 AM  Result Value Ref Range   Glucose-Capillary 324 (H) 70 - 99 mg/dL    Comment: Glucose reference range applies only to samples taken after fasting for at least 8 hours.  CBG monitoring, ED     Status: Abnormal   Collection Time: 08/07/23  5:17 PM  Result Value Ref Range   Glucose-Capillary 276 (H) 70 - 99 mg/dL    Comment: Glucose reference range applies only to samples taken after fasting for at least 8 hours.  CBG monitoring, ED     Status: Abnormal   Collection Time: 08/07/23  7:41 PM  Result Value Ref Range   Glucose-Capillary 327 (H) 70 - 99 mg/dL    Comment: Glucose reference range applies only to samples taken after fasting for at least 8 hours.  Hemoglobin A1c     Status: Abnormal   Collection Time: 08/07/23  7:44 PM  Result Value Ref Range   Hgb A1c MFr Bld 12.5 (H) 4.8 - 5.6 %    Comment: (NOTE)         Prediabetes: 5.7 - 6.4         Diabetes: >6.4         Glycemic control for adults with diabetes: <7.0    Mean Plasma Glucose 312 mg/dL    Comment: (NOTE) Performed At: [facility] [address] [place], [place] [id number] Jolene Schimke MD EX:[id number]   CBG monitoring, ED     Status: Abnormal   Collection Time: 08/07/23  9:51 PM  Result Value Ref Range   Glucose-Capillary 364 (H) 70 - 99 mg/dL    Comment: Glucose reference range applies only to samples taken after fasting for at least 8 hours.  CBG monitoring, ED     Status: Abnormal   Collection Time: 08/08/23  9:00 AM  Result Value Ref Range   Glucose-Capillary 284 (H) 70 - 99 mg/dL    Comment: Glucose reference range applies only to samples taken after fasting for at least 8 hours.     Psychiatric Specialty Exam: Physical Exam  Review of Systems  Constitutional:  Positive for fatigue.  Psychiatric/Behavioral:  Positive for dysphoric mood and sleep disturbance. The patient is  nervous/anxious.     Weight 130 lb (59 kg).Body mass index is 23.03 kg/m.  General Appearance: Casual  Eye Contact:  Fair  Speech:  Normal Rate  Volume:  Decreased  Mood:  Anxious and Dysphoric  Affect:  Depressed  Thought Process:  Goal Directed  Orientation:  Full (Time, Place, and Person)  Thought Content:  Rumination  Suicidal Thoughts:  No  Homicidal Thoughts:  No  Memory:  Immediate;   Good Recent;   Good Remote;   Good  Judgement:  Intact  Insight:  Shallow  Psychomotor Activity:  Decreased  Concentration:  Concentration: Good and Attention Span: Fair  Recall:  Good  Fund of Knowledge:  Good  Language:  Good  Akathisia:  No  Handed:  Right  AIMS (if indicated):     Assets:  Communication Skills Desire for Improvement Housing Social Support  ADL's:  Intact  Cognition:  WNL  Sleep:  fair       09/18/2023    1:27 PM 08/25/2023    9:45 AM  Depression screen PHQ 2/9  Decreased Interest 1 1  Down, Depressed, Hopeless 1 2  PHQ - 2 Score 2 3  Altered sleeping 1 3  Tired, decreased energy 1 2  Change in appetite 1 0  Feeling bad or failure about yourself  0 1  Trouble concentrating 0 3  Moving slowly or fidgety/restless 0 3  Suicidal thoughts 0 0  PHQ-9 Score 5 15  Difficult doing work/chores Somewhat difficult Somewhat difficult       09/18/2023    1:26 PM 08/25/2023    9:48 AM  GAD 7 : Generalized Anxiety Score  Nervous, Anxious, on Edge 1 3  Control/stop worrying 1 3  Worry too much - different things 1 3  Trouble relaxing 1 3  Restless 1 2  Easily annoyed or irritable 0 2  Afraid - awful might happen 1 3  Total GAD 7 Score 6 19  Anxiety Difficulty Somewhat difficult Very difficult      Assessment/Plan: Moderate episode of recurrent major depressive disorder (HCC) - Plan: escitalopram (LEXAPRO) 20 MG tablet, hydrOXYzine (ATARAX) 10 MG tablet, buPROPion (WELLBUTRIN XL) 150 MG 24 hr tablet  Generalized anxiety disorder - Plan: escitalopram (LEXAPRO)  20 MG tablet, hydrOXYzine (ATARAX) 10 MG tablet  Insomnia due to other mental disorder - Plan: hydrOXYzine (ATARAX) 10 MG tablet  History of ADHD - Plan: buPROPion (WELLBUTRIN XL) 150 MG 24 hr tablet  I reviewed psychosocial history, current medication, blood work results and recent screening PHQ and anxiety scores.  Her scores are much improved.  Currently she is taking multiple medication for her psychiatric symptoms.  I discussed avoiding polypharmacy to help her energy level.  In the past she had tried Wellbutrin with good response.  I recommend to start Wellbutrin XL 150 mg in the morning and increase the Lexapro 20 mg to help her depression, anxiety.  Patient is wondering if she can go back on Adderall however after discussing risk and benefit I recommend trying Wellbutrin may be more helpful to help with anxiety and her ADD symptoms.  Other option is to consider Strattera.  Patient to call her ADD testing was done in [place] where she was working but no recent testing done.  Recommend not to take the doxepin and BuSpar but she could try hydroxyzine 10 mg up to 2 times a day to help with anxiety which will also help her sleep.  I encouraged to watch her calorie intake, exercise, walking, keep the appointment with primary care tomorrow.  She is monitoring her blood sugar which is slightly improved from the past but is still she has a high readings.  I encourage to connect with her endocrinologist if medicine adjustment needs to be done.  We will try to get records from Donnie Aho from [place] counseling.  I discussed considering therapy to help with coping skills and patient agreed to give a try.  We will refer her counseling.  I recommend to call us back if she has any question.  I also recommend to get sooner appointment if she feels worsening of the symptoms or having any side effects of the medication.   Follow Up Instructions:     I discussed the assessment and treatment plan with the  patient. The patient was provided an opportunity to ask questions and all were answered. The patient agreed with  the plan and demonstrated an understanding of the instructions.   The patient was advised to call back or seek an in-person evaluation if the symptoms worsen or if the condition fails to improve as anticipated.    Collaboration of Care: Other provider involved in patient's care AEB notes are available in epic to review  Patient/Guardian was advised Release of Information must be obtained prior to any record release in order to collaborate their care with an outside provider. Patient/Guardian was advised if they have not already done so to contact the registration department to sign all necessary forms in order for [place] to release information regarding their care.   Consent: Patient/Guardian gives verbal consent for treatment and assignment of benefits for services provided during this visit. Patient/Guardian expressed understanding and agreed to proceed.     I provided 45 minutes of non face to face time during this encounter.  Note: This document was prepared by [employer] voice dictation technology and any errors that results from this process are unintentional.    Cleotis Nipper, MD 09/18/2023

## 2023-09-19 DIAGNOSIS — E041 Nontoxic single thyroid nodule: Secondary | ICD-10-CM | POA: Diagnosis not present

## 2023-09-19 DIAGNOSIS — F332 Major depressive disorder, recurrent severe without psychotic features: Secondary | ICD-10-CM | POA: Diagnosis not present

## 2023-09-19 DIAGNOSIS — Z9882 Breast implant status: Secondary | ICD-10-CM | POA: Diagnosis not present

## 2023-09-19 DIAGNOSIS — R1310 Dysphagia, unspecified: Secondary | ICD-10-CM | POA: Diagnosis not present

## 2023-09-19 DIAGNOSIS — F17210 Nicotine dependence, cigarettes, uncomplicated: Secondary | ICD-10-CM | POA: Diagnosis not present

## 2023-09-19 DIAGNOSIS — Z1231 Encounter for screening mammogram for malignant neoplasm of breast: Secondary | ICD-10-CM | POA: Diagnosis not present

## 2023-09-19 DIAGNOSIS — E1169 Type 2 diabetes mellitus with other specified complication: Secondary | ICD-10-CM | POA: Diagnosis not present

## 2023-09-19 DIAGNOSIS — R92333 Mammographic heterogeneous density, bilateral breasts: Secondary | ICD-10-CM | POA: Diagnosis not present

## 2023-09-19 DIAGNOSIS — Z794 Long term (current) use of insulin: Secondary | ICD-10-CM | POA: Diagnosis not present

## 2023-09-19 DIAGNOSIS — Z853 Personal history of malignant neoplasm of breast: Secondary | ICD-10-CM | POA: Insufficient documentation

## 2023-09-20 DIAGNOSIS — H524 Presbyopia: Secondary | ICD-10-CM | POA: Diagnosis not present

## 2023-09-20 DIAGNOSIS — H2513 Age-related nuclear cataract, bilateral: Secondary | ICD-10-CM | POA: Diagnosis not present

## 2023-09-28 ENCOUNTER — Ambulatory Visit (HOSPITAL_COMMUNITY)
Admission: RE | Admit: 2023-09-28 | Discharge: 2023-09-28 | Disposition: A | Discharge status: Home / Self Care | Payer: Medicare Other | Source: Ambulatory Visit | Attending: Gastroenterology | Admitting: Gastroenterology

## 2023-09-28 ENCOUNTER — Ambulatory Visit: Payer: Medicare Other | Admitting: Gastroenterology

## 2023-09-28 ENCOUNTER — Encounter: Payer: Self-pay | Admitting: Gastroenterology

## 2023-09-28 VITALS — BP 110/68 | HR 68 | Ht 63.0 in | Wt 137.0 lb

## 2023-09-28 DIAGNOSIS — R1312 Dysphagia, oropharyngeal phase: Secondary | ICD-10-CM

## 2023-09-28 DIAGNOSIS — R1310 Dysphagia, unspecified: Secondary | ICD-10-CM | POA: Diagnosis not present

## 2023-09-28 DIAGNOSIS — E042 Nontoxic multinodular goiter: Secondary | ICD-10-CM | POA: Diagnosis not present

## 2023-09-28 DIAGNOSIS — Z860100 Personal history of colon polyps, unspecified: Secondary | ICD-10-CM

## 2023-09-28 DIAGNOSIS — E041 Nontoxic single thyroid nodule: Secondary | ICD-10-CM

## 2023-09-28 DIAGNOSIS — F17200 Nicotine dependence, unspecified, uncomplicated: Secondary | ICD-10-CM

## 2023-09-28 DIAGNOSIS — R499 Unspecified voice and resonance disorder: Secondary | ICD-10-CM | POA: Insufficient documentation

## 2023-09-28 NOTE — Patient Instructions (Signed)
You have been scheduled for an thyroid ultrasound at [facility] Radiology (1st floor of hospital) on Thursday, 2-13 at 2:30pm. Should you need to reschedule your appointment, please contact radiology at [phone]. This test typically takes about 30 minutes to perform.  We are referring you to [facility].  They will contact you directly to schedule an appointment.   Please feel free to contact us if you have not heard from them within 2 days and we will follow up on the referral.   We will request your colonoscopy records from Dr. Kenna Gilbert office.  Thank you for entrusting me with your care and for choosing [facility], Dr. Ileene Patrick  If your blood pressure at your visit was 140/90 or greater, please contact your primary care physician to follow up on this. ______________________________________________________  If you are age 68 or older, your body mass index should be between 23-30. Your Body mass index is 24.27 kg/m. If this is out of the aforementioned range listed, please consider follow up with your Primary Care Provider.  If you are age 36 or younger, your body mass index should be between 19-25. Your Body mass index is 24.27 kg/m. If this is out of the aformentioned range listed, please consider follow up with your Primary Care Provider.  ________________________________________________________  The [facility] providers would like to encourage you to use [facility] to communicate with providers for non-urgent requests or questions.  Due to long hold times on the telephone, sending your provider a message by [facility] may be a faster and more efficient way to get a response.  Please allow 48 business hours for a response.  Please remember that this is for non-urgent requests.  _______________________________________________________  Due to recent changes in healthcare laws, you may see the results of your imaging and laboratory studies on MyChart before your provider has had a  chance to review them.  We understand that in some cases there may be results that are confusing or concerning to you. Not all laboratory results come back in the same time frame and the provider may be waiting for multiple results in order to interpret others.  Please give [place] 48 hours in order for your provider to thoroughly review all the results before contacting the office for clarification of your results.

## 2023-09-28 NOTE — Progress Notes (Signed)
HPI :  68 year old female with a history of thyroid nodules, colon polyps diabetes, breast cancer, here to establish care for dysphagia.  She reports dysphagia for the past month or so.  She localizes this to her throat/posterior pharynx.  She states swallowing anything such as pills or foods will be difficult to swallow and get stuck.  She states she has a hard time initiating the swallow to get the food into her esophagus.  Once she gets it past her throat she states it goes down into her stomach without a problem.  She also endorses a very raspy voice and voice change.  She feels as if she has "laryngitis all the time".  She eats well otherwise with a good appetite.  No nausea or vomiting.  She rarely has much of any reflux that bothers her.  She has not lost any weight.  She has a long term tobacco user for the past 30 years.  She had a thyroid ultrasound back in August 2019, multiple nodules noted, she was told to have a repeat ultrasound in 1 year for surveillance of the larger nodules, she does not recall ever doing that.  She did have a colonoscopy with Dr. Dickie La in 2011, she had a piecemeal removal of a large polyp.  She denies any bowel troubles.  No blood in her stools.  No abdominal pains.  She states she had a colonoscopy with Dr. Loreta Ave since that time.  We reached out to get those records.  After she left the office they arrived.  As outlined below.   Colonoscopy 01/01/2010 - Dr. Juanda Chance - FINDINGS:  A sessile polyp was found in the cecum. large 20mm     multilobulated polyp in the cecal pouch removed piecemeal Polyp     was snared, then cauterized with monopolar cautery. Retrieval was     successful (see image1 and image2). snare polyp  This was     otherwise a normal examination of the colon (see image5, image4,     and image3).   Retroflexed views in the rectum revealed no     abnormalities.    The scope was then withdrawn from the patient     and the procedure  completed.  FINDINGS:  A sessile polyp was found in the cecum. large 20mm     multilobulated polyp in the cecal pouch removed piecemeal Polyp     was snared, then cauterized with monopolar cautery. Retrieval was     successful (see image1 and image2). snare polyp  This was     otherwise a normal examination of the colon (see image5, image4,     and image3).   Retroflexed views in the rectum revealed no     abnormalities.    The scope was then withdrawn from the patient     and the procedure completed.   Thyroid US: IMPRESSION: Thyroid enlargement with multinodular goiter. A 1.3 cm solid right mid nodule (nodule 2 above) and a 2.4 cm left superior nodule (nodule 6 above) both meet criteria for follow-up ultrasound in 1 year. All of the nodules are cystic throughout the thyroid and do not meet criteria for biopsy or further follow-up.   The above is in keeping with the ACR TI-RADS recommendations - J Am Coll Radiol 2017;14:587-595.   Colonoscopy-12/30/2019-DrLoreta Ave -3 small sessile polyps in the descending colon removed cold forceps, two 7 to 8 mm polyps removed in the ascending colon, removed via cold snare, diverticulosis in the sigmoid colon,  normal ileum, normal colon otherwise.  Path shows all left-sided polyps were hyperplastic, one of the 2 ascending polyps was an adenoma, the other was inflammatory.  Repeat colonoscopy in 5 years.   Past Medical History:  Diagnosis Date   ADD (attention deficit disorder)    Breast cancer (HCC)    Chest pain    Depression    Diabetes mellitus without complication (HCC)    Dyslipidemia    Kidney stone    Sleep disturbance      Past Surgical History:  Procedure Laterality Date   MASTECTOMY  1999   Reconstructive Breast Surgery  2000,2010   TONSILLECTOMY  1974   Family History  Problem Relation Age of Onset   Hypertension Mother    Diabetes Mother    Hypertension Father    Colon cancer Neg Hx    Stomach cancer Neg Hx     Esophageal cancer Neg Hx    Social History   Tobacco Use   Smoking status: Every Day    Current packs/day: 1.00    Types: Cigarettes  Vaping Use   Vaping status: Never Used  Substance Use Topics   Alcohol use: Yes    Alcohol/week: 0.0 standard drinks of alcohol    Comment: socially   Drug use: No   Current Outpatient Medications  Medication Sig Dispense Refill   buPROPion (WELLBUTRIN XL) 150 MG 24 hr tablet Take 1 tablet (150 mg total) by mouth daily. 30 tablet 0   busPIRone (BUSPAR) 7.5 MG tablet Take 1 tablet (7.5 mg total) by mouth 2 (two) times daily. 60 tablet 0   doxepin (SINEQUAN) 10 MG capsule Take 1 capsule (10 mg total) by mouth at bedtime. 30 capsule 0   escitalopram (LEXAPRO) 20 MG tablet Take 1 tablet (20 mg total) by mouth daily. 30 tablet 0   hydrOXYzine (ATARAX) 10 MG tablet Take 1 tablet (10 mg total) by mouth 2 (two) times daily as needed for anxiety. 60 tablet 0   ibuprofen (ADVIL) 200 MG tablet Take 400-600 mg by mouth every 6 (six) hours as needed for mild pain (pain score 1-3) or moderate pain (pain score 4-6).     insulin aspart protamine- aspart (NOVOLOG MIX 70/30) (70-30) 100 UNIT/ML injection Inject 0.1 mLs (10 Units total) into the skin 2 (two) times daily with a meal. 10 mL 11   Insulin Syringes, Disposable, (B-D INSULIN SYRINGE 1CC) U-100 1 ML MISC 1 each by Does not apply route in the morning and at bedtime. 100 each 0   No current facility-administered medications for this visit.   Allergies  Allergen Reactions   Morphine     REACTION: itching   Penicillins     REACTION: hives   Sulfonamide Derivatives     REACTION: itching, hives, looses eye sight     Review of Systems: All systems reviewed and negative except where noted in HPI.   Lab Results  Component Value Date   WBC 8.4 08/06/2023   HGB 17.2 (H) 08/06/2023   HCT 48.7 (H) 08/06/2023   MCV 84.8 08/06/2023   PLT 366 08/06/2023    Lab Results  Component Value Date   NA 135  08/06/2023   CL 99 08/06/2023   K 4.5 08/06/2023   CO2 23 08/06/2023   BUN 17 08/06/2023   CREATININE 0.46 08/06/2023   GFRNONAA >60 08/06/2023   CALCIUM 10.5 (H) 08/06/2023   ALBUMIN 4.7 08/06/2023   GLUCOSE 389 (H) 08/06/2023    Lab Results  Component Value Date   ALT 16 08/06/2023   AST 19 08/06/2023   ALKPHOS 124 08/06/2023   BILITOT 1.1 08/06/2023     Physical Exam: BP 110/68   Pulse 68   Ht 5\' 3"  (1.6 m)   Wt 137 lb (62.1 kg)   BMI 24.27 kg/m  Constitutional: Pleasant,well-developed, female in no acute distress. HEENT: Normocephalic and atraumatic. Conjunctivae are normal. No scleral icterus. Neck supple. No obvious lymphadenopathy or large nodules Cardiovascular: Normal rate, regular rhythm.  Pulmonary/chest: Effort normal and breath sounds normal.  Abdominal: Soft, nondistended, nontender.  There are no masses palpable. Extremities: no edema Lymphadenopathy: No cervical adenopathy noted. Neurological: Alert and oriented to person place and time. Skin: Skin is warm and dry. No rashes noted. Psychiatric: Normal mood and affect. Behavior is normal.   ASSESSMENT: 68 y.o. female here for assessment of the following  1. Oropharyngeal dysphagia   2. Change in voice   3. Thyroid nodule   4. Tobacco use disorder   5. History of colon polyps    Patient with what sounds like oropharyngeal dysphagia but significant change in her voice, with a history of thyroid nodule 6 years ago which have not been followed up upon as previously recommended.  She also has history of significant tobacco use.  Her symptoms today do not sound esophageal.  I think she needs reassessment of her thyroid nodules to ensure no interval malignancy and an ENT evaluation in light of her voice changes and tobacco use to ensure no problem in her posterior pharynx.  I have ordered an ASAP ultrasound and referral to ENT.  If these exams are unrevealing or fail to show a clear cause for her symptoms  then we can consider EGD but I think at least initially for today, perhaps less likely to show [place] cause for her symptoms.  She does not endorse much reflux, I doubt that is the cause of her change in voice.  Otherwise after she left her prior colonoscopy report arrived.  She has a history of advanced polyps and would repeat a colonoscopy in 5 years.  PLAN: - thyroid US ASAP - reassess nodules, assess for malignancy - referral to ENT ASAP - voice change, thyroid nodules - needs laryngoscopy - if workup negative, can consider EGD but think less likely esophageal right now - repeat colonoscopy in 12/2024  Harlin Rain, MD [facility]  CC: Richmond Campbell., PA-C

## 2023-10-10 DIAGNOSIS — E1165 Type 2 diabetes mellitus with hyperglycemia: Secondary | ICD-10-CM | POA: Diagnosis not present

## 2023-10-11 ENCOUNTER — Telehealth (HOSPITAL_BASED_OUTPATIENT_CLINIC_OR_DEPARTMENT_OTHER): Payer: Medicare Other | Admitting: Psychiatry

## 2023-10-11 ENCOUNTER — Encounter (HOSPITAL_COMMUNITY): Payer: Self-pay | Admitting: Psychiatry

## 2023-10-11 VITALS — Wt 137.0 lb

## 2023-10-11 DIAGNOSIS — F99 Mental disorder, not otherwise specified: Secondary | ICD-10-CM

## 2023-10-11 DIAGNOSIS — F902 Attention-deficit hyperactivity disorder, combined type: Secondary | ICD-10-CM | POA: Diagnosis not present

## 2023-10-11 DIAGNOSIS — F5105 Insomnia due to other mental disorder: Secondary | ICD-10-CM

## 2023-10-11 DIAGNOSIS — F411 Generalized anxiety disorder: Secondary | ICD-10-CM

## 2023-10-11 DIAGNOSIS — F331 Major depressive disorder, recurrent, moderate: Secondary | ICD-10-CM | POA: Diagnosis not present

## 2023-10-11 LAB — LAB REPORT - SCANNED
A1c: 11
EGFR (Non-African Amer.): 99

## 2023-10-11 MED ORDER — REXULTI 0.5 MG PO TABS: 0.5000 mg | ORAL_TABLET | Freq: Every day | ORAL | 0 refills | Status: DC

## 2023-10-11 MED ORDER — BUPROPION HCL ER (XL) 150 MG PO TB24: 150.0000 mg | ORAL_TABLET | Freq: Every day | ORAL | 0 refills | Status: DC

## 2023-10-11 MED ORDER — ESCITALOPRAM OXALATE 20 MG PO TABS: 20.0000 mg | ORAL_TABLET | Freq: Every day | ORAL | 0 refills | Status: DC

## 2023-10-11 MED ORDER — TRAZODONE HCL 50 MG PO TABS
50.0000 mg | ORAL_TABLET | Freq: Every evening | ORAL | 0 refills | Status: DC | PRN
Start: 2023-10-11 — End: 2023-11-07

## 2023-10-11 NOTE — Progress Notes (Signed)
 BH MD/PA/NP OP Progress Note  Virtual Visit via Video Note  I connected with Lacey Perkins on 10/11/23 at 11:00 AM EST by a video enabled telemedicine application and verified that I am speaking with the correct person using two identifiers.  Location: Patient: Home Provider: Office   I discussed the limitations of evaluation and management by telemedicine and the availability of in person appointments. The patient expressed understanding and agreed to proceed.  10/11/2023 11:01 AM Lacey Perkins  MRN:  [id number]  Chief Complaint:  Chief Complaint  Patient presents with   Follow-up   Anxiety   Depression   HPI: Patient is 68 year old Caucasian, divorced unemployed female who was seen first time few weeks ago.  She was referred from [facility] behavioral health due to insurance reason.  She has admission in December at [facility].  Patient was taking multiple medication including BuSpar, doxepin, Lexapro, trazodone and hydroxyzine.  Medicines were adjusted.  Recommend to start the Wellbutrin XL 150 which she had taken in the past by previous psychiatrist and recommend to discontinue doxepin and BuSpar.  Patient was complaining of brain fog and like to go back to on Adderall however she was recommend to try the Wellbutrin to help the attention and focus.  Patient feel no major improvement and still have brain fog and lack of motivation to do things.  She does go outside for dog walk but do not feel enjoyment in her life.  She denies any suicidal thoughts or crying spells but again do not leave the house and had sedentary lifestyle.  She gained few pounds.  Patient like to try Rexulti which she had seen the advertisement on the TV and her sister-in-law recommend to try.  She feels her mood is flat and sometimes have negative thoughts but no paranoia, aggression or violence.  She struggle with sleep.  She like to go back on trazodone which was given when she was  admitted.  Patient is a retired Financial controller.  Her brother lives close by.  She is very close to her sister-in-law.  She denies drinking or using any illegal substances.    Visit Diagnosis:    ICD-10-CM   1. Moderate episode of recurrent major depressive disorder (HCC)  F33.1 buPROPion (WELLBUTRIN XL) 150 MG 24 hr tablet    escitalopram (LEXAPRO) 20 MG tablet    Brexpiprazole (REXULTI) 0.5 MG TABS    2. Generalized anxiety disorder  F41.1 buPROPion (WELLBUTRIN XL) 150 MG 24 hr tablet    escitalopram (LEXAPRO) 20 MG tablet    Brexpiprazole (REXULTI) 0.5 MG TABS    3. Insomnia due to other mental disorder  F51.05 traZODone (DESYREL) 50 MG tablet   F99     4. Attention deficit hyperactivity disorder (ADHD), combined type  F90.2 buPROPion (WELLBUTRIN XL) 150 MG 24 hr tablet      Past Psychiatric History: Reviewed H/O ADD, depression, anxiety most of life.  Saw Donnie Aho at [facility] for 20 years.  Remember taking Ambien, Wellbutrin, Klonopin, trazodone and Adderall.  No history of suicidal attempt but inpatient at [facility] in December 2024.  Discharged on trazodone, Lexapro, BuSpar and Sinequan.  H/O break-ups , flashbacks and intrusive thoughts.  No history of psychosis.   Past Medical History:  Past Medical History:  Diagnosis Date   ADD (attention deficit disorder)    Breast cancer (HCC)    Chest pain    Depression    Diabetes mellitus without complication (HCC)  Dyslipidemia    Kidney stone    Sleep disturbance     Past Surgical History:  Procedure Laterality Date   MASTECTOMY  1999   Reconstructive Breast Surgery  2000,2010   TONSILLECTOMY  1974    Family Psychiatric History: Reviewed  Family History:  Family History  Problem Relation Age of Onset   Hypertension Mother    Diabetes Mother    Hypertension Father    Colon cancer Neg Hx    Stomach cancer Neg Hx    Esophageal cancer Neg Hx     Social History:  Social History    Socioeconomic History   Marital status: Divorced    Spouse name: Not on file   Number of children: 3   Years of education: Not on file   Highest education level: Not on file  Occupational History   Occupation: retired  Tobacco Use   Smoking status: Every Day    Current packs/day: 1.00    Types: Cigarettes   Smokeless tobacco: Not on file  Vaping Use   Vaping status: Never Used  Substance and Sexual Activity   Alcohol use: Yes    Alcohol/week: 0.0 standard drinks of alcohol    Comment: socially   Drug use: No   Sexual activity: Not on file  Other Topics Concern   Not on file  Social History Narrative   Not on file   Social Drivers of Health   Financial Resource Strain: Not on file  Food Insecurity: Not on file  Transportation Needs: Not on file  Physical Activity: Not on file  Stress: Not on file  Social Connections: Not on file    Allergies:  Allergies  Allergen Reactions   Morphine     REACTION: itching   Penicillins     REACTION: hives   Sulfonamide Derivatives     REACTION: itching, hives, looses eye sight    Metabolic Disorder Labs: Lab Results  Component Value Date   HGBA1C 12.5 (H) 08/07/2023   MPG 312 08/07/2023   No results found for: "PROLACTIN" No results found for: "CHOL", "TRIG", "HDL", "CHOLHDL", "VLDL", "LDLCALC" No results found for: "TSH"  Therapeutic Level Labs: No results found for: "LITHIUM" No results found for: "VALPROATE" No results found for: "CBMZ"  Current Medications: Current Outpatient Medications  Medication Sig Dispense Refill   Brexpiprazole (REXULTI) 0.5 MG TABS Take 1 tablet (0.5 mg total) by mouth daily. 30 tablet 0   traZODone (DESYREL) 50 MG tablet Take 1 tablet (50 mg total) by mouth at bedtime as needed for sleep. 30 tablet 0   buPROPion (WELLBUTRIN XL) 150 MG 24 hr tablet Take 1 tablet (150 mg total) by mouth daily. 30 tablet 0   escitalopram (LEXAPRO) 20 MG tablet Take 1 tablet (20 mg total) by mouth daily.  30 tablet 0   ibuprofen (ADVIL) 200 MG tablet Take 400-600 mg by mouth every 6 (six) hours as needed for mild pain (pain score 1-3) or moderate pain (pain score 4-6).     insulin aspart protamine- aspart (NOVOLOG MIX 70/30) (70-30) 100 UNIT/ML injection Inject 0.1 mLs (10 Units total) into the skin 2 (two) times daily with a meal. 10 mL 11   Insulin Syringes, Disposable, (B-D INSULIN SYRINGE 1CC) U-100 1 ML MISC 1 each by Does not apply route in the morning and at bedtime. 100 each 0   No current facility-administered medications for this visit.     Musculoskeletal: Strength & Muscle Tone: within normal limits Gait & Station:  normal Patient leans: N/A  Psychiatric Specialty Exam: Review of Systems  Weight 137 lb (62.1 kg).There is no height or weight on file to calculate BMI.  General Appearance: Fairly Groomed  Eye Contact:  Fair  Speech:  Slow  Volume:  Decreased  Mood:  Depressed and Dysphoric  Affect:  Constricted  Thought Process:  Goal Directed  Orientation:  Full (Time, Place, and Person)  Thought Content: Rumination   Suicidal Thoughts:  No  Homicidal Thoughts:  No  Memory:  Immediate;   Good Recent;   Good Remote;   Fair  Judgement:  Fair  Insight:  Shallow  Psychomotor Activity:  Decreased  Concentration:  Concentration: Fair and Attention Span: Fair  Recall:  [employer] of Knowledge: Fair  Language: Good  Akathisia:  No  Handed:  Right  AIMS (if indicated): not done  Assets:  Communication Skills Desire for Improvement Housing Social Support Transportation  ADL's:  Intact  Cognition: WNL  Sleep:  Fair   Screenings: GAD-7    Flowsheet Row Video Visit from [facility]/18/2023 in [facility] Office Visit from 08/25/2023 in [facility]  Total GAD-7 Score 6 19      PHQ2-9    Flowsheet Row Video Visit from 10/11/2023 in [facility] Video Visit from 09/18/2023  in [facility] Office Visit from 08/25/2023 in [place]  PHQ-2 Total Score 3 2 3   PHQ-9 Total Score 7 5 15       Flowsheet Row Video Visit from 10/11/2023 in [facility] Video Visit from 09/18/2023 in [facility] Office Visit from 08/25/2023 in [facility]  C-SSRS RISK CATEGORY Error: Q7 should not be populated when Q6 is No Error: Q7 should not be populated when Q6 is No Low Risk        Assessment and Plan: I reviewed current medication.  Patient is no longer taking BuSpar, doxepin and hydroxyzine.  She struggled with chronic depression, brain fog, decreased energy, motivation to do things.  She is not sleeping well.  She like to try Rexulti.  She also want to go back to trazodone which she took while in the hospital and worked.  She also want to know if she can go back to Adderall.  We discussed stimulants concerns.  We do not have any records from her previous psychiatrist including any psychological testing.  We have discussed in the past to consider Strattera but patient decided to start the Wellbutrin which had helped her in the past and had benefits to address her attention concentration and motivation.  I recommend to try Rexulti 0.5 mg daily.  Explained it is antipsychotic medication but does help with depressive symptoms.  Patient had seen the advertisement on the TV and like to try.  Will also start trazodone 50 mg at bedtime to help sleep.  For now continue Wellbutrin XL 150 mg daily and Lexapro 20 mg daily.  Will consider optimizing the Wellbutrin dose in the future.  Patient had not started therapy but like to get a referral.  We will send therapy referrals.  Encouraged to call us back if she is any question or any concern.  Follow-up in 4 weeks.  Discussed safety concerns at any time having active suicidal thoughts  or homicidal thoughts that she need to call 911 or go to local emergency room.  Collaboration of Care: Collaboration of Care:  Other provider involved in patient's care AEB notes are available in epic to review  Patient/Guardian was advised Release of Information must be obtained prior to any record release in order to collaborate their care with an outside provider. Patient/Guardian was advised if they have not already done so to contact the registration department to sign all necessary forms in order for [place] to release information regarding their care.   Consent: Patient/Guardian gives verbal consent for treatment and assignment of benefits for services provided during this visit. Patient/Guardian expressed understanding and agreed to proceed.    Follow Up Instructions:    I discussed the assessment and treatment plan with the patient. The patient was provided an opportunity to ask questions and all were answered. The patient agreed with the plan and demonstrated an understanding of the instructions.   The patient was advised to call back or seek an in-person evaluation if the symptoms worsen or if the condition fails to improve as anticipated.  I provided 29 minutes of non-face-to-face time during this encounter.  Cleotis Nipper, MD 10/11/2023, 11:01 AM

## 2023-10-12 ENCOUNTER — Other Ambulatory Visit (HOSPITAL_COMMUNITY): Payer: Self-pay | Admitting: Psychiatry

## 2023-10-12 DIAGNOSIS — F5105 Insomnia due to other mental disorder: Secondary | ICD-10-CM

## 2023-10-12 DIAGNOSIS — F331 Major depressive disorder, recurrent, moderate: Secondary | ICD-10-CM

## 2023-10-12 DIAGNOSIS — F411 Generalized anxiety disorder: Secondary | ICD-10-CM

## 2023-10-17 DIAGNOSIS — F332 Major depressive disorder, recurrent severe without psychotic features: Secondary | ICD-10-CM | POA: Diagnosis not present

## 2023-10-17 DIAGNOSIS — Z0000 Encounter for general adult medical examination without abnormal findings: Secondary | ICD-10-CM | POA: Diagnosis not present

## 2023-10-18 ENCOUNTER — Encounter (HOSPITAL_COMMUNITY): Payer: Self-pay

## 2023-10-24 DIAGNOSIS — E042 Nontoxic multinodular goiter: Secondary | ICD-10-CM | POA: Diagnosis not present

## 2023-10-24 DIAGNOSIS — E1165 Type 2 diabetes mellitus with hyperglycemia: Secondary | ICD-10-CM | POA: Diagnosis not present

## 2023-10-24 NOTE — Telephone Encounter (Signed)
 Hi Regan, these messages are coming directly to my inbox.  I did not see them until today.  Can you please provide samples of Rexulti and inform the patient.  She also need a therapist referral.  We may need to consider low-dose Abilify if getting Rexulti in the future is a problem.

## 2023-10-31 ENCOUNTER — Other Ambulatory Visit (HOSPITAL_COMMUNITY): Payer: Self-pay | Admitting: *Deleted

## 2023-10-31 DIAGNOSIS — F331 Major depressive disorder, recurrent, moderate: Secondary | ICD-10-CM

## 2023-10-31 DIAGNOSIS — F411 Generalized anxiety disorder: Secondary | ICD-10-CM

## 2023-10-31 MED ORDER — REXULTI 0.5 MG PO TABS: 0.5000 mg | ORAL_TABLET | Freq: Every day | ORAL | Status: DC

## 2023-11-01 DIAGNOSIS — K080 Exfoliation of teeth due to systemic causes: Secondary | ICD-10-CM | POA: Diagnosis not present

## 2023-11-02 DIAGNOSIS — F17210 Nicotine dependence, cigarettes, uncomplicated: Secondary | ICD-10-CM | POA: Diagnosis not present

## 2023-11-02 DIAGNOSIS — Z122 Encounter for screening for malignant neoplasm of respiratory organs: Secondary | ICD-10-CM | POA: Diagnosis not present

## 2023-11-05 ENCOUNTER — Other Ambulatory Visit (HOSPITAL_COMMUNITY): Payer: Self-pay | Admitting: Psychiatry

## 2023-11-05 DIAGNOSIS — F411 Generalized anxiety disorder: Secondary | ICD-10-CM

## 2023-11-05 DIAGNOSIS — F99 Mental disorder, not otherwise specified: Secondary | ICD-10-CM

## 2023-11-05 DIAGNOSIS — F331 Major depressive disorder, recurrent, moderate: Secondary | ICD-10-CM

## 2023-11-05 DIAGNOSIS — F902 Attention-deficit hyperactivity disorder, combined type: Secondary | ICD-10-CM

## 2023-11-07 ENCOUNTER — Telehealth (HOSPITAL_BASED_OUTPATIENT_CLINIC_OR_DEPARTMENT_OTHER): Payer: Self-pay | Admitting: Psychiatry

## 2023-11-07 ENCOUNTER — Encounter (HOSPITAL_COMMUNITY): Payer: Self-pay | Admitting: Psychiatry

## 2023-11-07 ENCOUNTER — Other Ambulatory Visit (HOSPITAL_COMMUNITY): Payer: Self-pay

## 2023-11-07 VITALS — Wt 137.0 lb

## 2023-11-07 DIAGNOSIS — F331 Major depressive disorder, recurrent, moderate: Secondary | ICD-10-CM | POA: Diagnosis not present

## 2023-11-07 DIAGNOSIS — F411 Generalized anxiety disorder: Secondary | ICD-10-CM

## 2023-11-07 DIAGNOSIS — F5105 Insomnia due to other mental disorder: Secondary | ICD-10-CM | POA: Diagnosis not present

## 2023-11-07 DIAGNOSIS — F902 Attention-deficit hyperactivity disorder, combined type: Secondary | ICD-10-CM

## 2023-11-07 MED ORDER — BUPROPION HCL ER (XL) 300 MG PO TB24: 300.0000 mg | ORAL_TABLET | Freq: Every day | ORAL | 1 refills | Status: DC

## 2023-11-07 MED ORDER — ESCITALOPRAM OXALATE 20 MG PO TABS: 20.0000 mg | ORAL_TABLET | Freq: Every day | ORAL | 1 refills | Status: DC

## 2023-11-07 MED ORDER — REXULTI 0.5 MG PO TABS: 0.5000 mg | ORAL_TABLET | Freq: Every day | ORAL | 1 refills | Status: DC

## 2023-11-07 MED ORDER — ESZOPICLONE 2 MG PO TABS: 2.0000 mg | ORAL_TABLET | Freq: Every evening | ORAL | 1 refills | Status: DC | PRN

## 2023-11-07 NOTE — Progress Notes (Signed)
 [facility] MD Virtual Progress Note   Patient Location: Home Provider Location: Home Office  I connect with patient by video and verified that I am speaking with correct person by using two identifiers. I discussed the limitations of evaluation and management by telemedicine and the availability of in person appointments. I also discussed with the patient that there may be a patient responsible charge related to this service. The patient expressed understanding and agreed to proceed.  Lacey Perkins [id number] 68 y.o.  11/07/2023 10:13 AM  History of Present Illness:  Patient is evaluated by video session.  She continues to struggle with lack of motivation to do things.  She has chronic insomnia.  In the past the trazodone did help but now she feels it did not help even though she tried to pick this.  We started her on Rexulti but she has not given an of time.  She does not leave the house unless it is doctors appointment.  However she does go outside with the dog.  She is now thinking about moving to a senior living so she has some friends.  She admitted a lot of boredom.  She does not have friends however good support from her sister-in-law.  She is taking Rexulti since last week.  She is compliant with Wellbutrin, Lexapro.  She like to try [place] which she recalled had worked in the past.  She continues to struggle keeping her blood sugar stable.  Her last hemoglobin A1c 3 months ago was 12.5.  She reported her mood sometimes flat but denies any suicidal thoughts or homicidal thoughts.  She understand that she need to have social network and so far she has not able to find one.  We have referred therapy but patient has not scheduled appointment with therapist.  We also recommend sleep study but patient did not had discussion with her primary care.  She is okay if we can refer for sleep study.  Patient is a retired Financial controller and she reported her job was very busy but  since he retired she has too much time.  She denies any paranoia, hallucination.  She denies any irritability.  Sometimes she is struggling with attention and focus but mostly manageable.  Past Psychiatric History: H/O ADD, depression, anxiety most of life.  Saw Donnie Aho at [facility] for 20 years.  Remember taking Ambien, Wellbutrin, Klonopin, trazodone and Adderall.  No history of suicidal attempt but inpatient at [facility] in December 2024.  Discharged on trazodone, Lexapro, BuSpar and [place].  H/O break-ups , flashbacks and intrusive thoughts.  Restart trazodone did not helped. No history of psychosis.    Outpatient Encounter Medications as of 11/07/2023  Medication Sig   Brexpiprazole (REXULTI) 0.5 MG TABS Take 1 tablet (0.5 mg total) by mouth daily.   buPROPion (WELLBUTRIN XL) 150 MG 24 hr tablet Take 1 tablet (150 mg total) by mouth daily.   escitalopram (LEXAPRO) 20 MG tablet Take 1 tablet (20 mg total) by mouth daily.   ibuprofen (ADVIL) 200 MG tablet Take 400-600 mg by mouth every 6 (six) hours as needed for mild pain (pain score 1-3) or moderate pain (pain score 4-6).   insulin aspart protamine- aspart (NOVOLOG MIX 70/30) (70-30) 100 UNIT/ML injection Inject 0.1 mLs (10 Units total) into the skin 2 (two) times daily with a meal.   Insulin Syringes, Disposable, (B-D INSULIN SYRINGE 1CC) U-100 1 ML MISC 1 each by Does not apply route in the morning and  at bedtime.   traZODone (DESYREL) 50 MG tablet Take 1 tablet (50 mg total) by mouth at bedtime as needed for sleep.   No facility-administered encounter medications on file as of 11/07/2023.    No results found for this or any previous visit (from the past 2160 hours).   Psychiatric Specialty Exam: Physical Exam  Review of Systems  Weight 137 lb (62.1 kg).There is no height or weight on file to calculate BMI.  General Appearance: Casual  Eye Contact:  Good  Speech:  Normal Rate  Volume:  Normal   Mood:  Dysphoric  Affect:  Congruent  Thought Process:  Goal Directed  Orientation:  Full (Time, Place, and Person)  Thought Content:  Rumination  Suicidal Thoughts:  No  Homicidal Thoughts:  No  Memory:  Immediate;   Good Recent;   Good Remote;   Good  Judgement:  Fair  Insight:  Present  Psychomotor Activity:  Normal  Concentration:  Concentration: Good and Attention Span: Good  Recall:  Good  Fund of Knowledge:  Good  Language:  Good  Akathisia:  No  Handed:  Right  AIMS (if indicated):     Assets:  Communication Skills Desire for Improvement Housing Social Support Transportation  ADL's:  Intact  Cognition:  WNL  Sleep:  fair     Assessment/Plan: Moderate episode of recurrent major depressive disorder (HCC) - Plan: Brexpiprazole (REXULTI) 0.5 MG TABS, buPROPion (WELLBUTRIN XL) 300 MG 24 hr tablet, escitalopram (LEXAPRO) 20 MG tablet, eszopiclone (LUNESTA) 2 MG TABS tablet  Generalized anxiety disorder - Plan: Brexpiprazole (REXULTI) 0.5 MG TABS, buPROPion (WELLBUTRIN XL) 300 MG 24 hr tablet, escitalopram (LEXAPRO) 20 MG tablet  Attention deficit hyperactivity disorder (ADHD), combined type - Plan: buPROPion (WELLBUTRIN XL) 300 MG 24 hr tablet  Insomnia due to other mental disorder - Plan: eszopiclone (LUNESTA) 2 MG TABS tablet  Discussed boredom and not have a lot of social network.  I agree that she is planning to move to senior living at [place].  She is hoping to find friends and motivation to do things.  Discontinue trazodone since it did not help her sleep.  We will start low-dose Lunesta 2 mg which she had at with good response in the past.  I also emphasized to get sleep study.  I will increase Wellbutrin dose to 300 and continue Lexapro 20 mg daily.  Continue Rexulti 0.5 mg as patient prefer the medication and like to give more time.  We have also encouraged to see a therapist and we will provide some names.  Patient told her insurance is now okay since she  meets the deductible and like to send the medication to the local pharmacy.  Discussed hypnotic abuse, tolerance, withdrawal.  Patient do not recall any issues with the last time in the past.  Follow-up in 6 weeks.  Encouraged to keep appointment with primary care and blood work.  Her last hemoglobin A1c 12.5.  She is on insulin pump.   Follow Up Instructions:     I discussed the assessment and treatment plan with the patient. The patient was provided an opportunity to ask questions and all were answered. The patient agreed with the plan and demonstrated an understanding of the instructions.   The patient was advised to call back or seek an in-person evaluation if the symptoms worsen or if the condition fails to improve as anticipated.    Collaboration of Care: Other provider involved in patient's care AEB notes are available in  epic to review.  Patient/Guardian was advised Release of Information must be obtained prior to any record release in order to collaborate their care with an outside provider. Patient/Guardian was advised if they have not already done so to contact the registration department to sign all necessary forms in order for [place] to release information regarding their care.   Consent: Patient/Guardian gives verbal consent for treatment and assignment of benefits for services provided during this visit. Patient/Guardian expressed understanding and agreed to proceed.     I provided 31 minutes of non face to face time during this encounter.  Note: This document was prepared by [employer] voice dictation technology and any errors that results from this process are unintentional.    Cleotis Nipper, MD 11/07/2023

## 2023-11-17 ENCOUNTER — Ambulatory Visit (INDEPENDENT_AMBULATORY_CARE_PROVIDER_SITE_OTHER): Payer: Medicare Other | Admitting: Otolaryngology

## 2023-11-17 ENCOUNTER — Encounter (INDEPENDENT_AMBULATORY_CARE_PROVIDER_SITE_OTHER): Payer: Self-pay | Admitting: Otolaryngology

## 2023-11-17 VITALS — BP 137/79 | HR 88 | Ht 62.0 in | Wt 135.0 lb

## 2023-11-17 DIAGNOSIS — E042 Nontoxic multinodular goiter: Secondary | ICD-10-CM

## 2023-11-17 DIAGNOSIS — J3089 Other allergic rhinitis: Secondary | ICD-10-CM

## 2023-11-17 DIAGNOSIS — R1310 Dysphagia, unspecified: Secondary | ICD-10-CM

## 2023-11-17 DIAGNOSIS — J3800 Paralysis of vocal cords and larynx, unspecified: Secondary | ICD-10-CM

## 2023-11-17 DIAGNOSIS — K219 Gastro-esophageal reflux disease without esophagitis: Secondary | ICD-10-CM

## 2023-11-17 DIAGNOSIS — R0982 Postnasal drip: Secondary | ICD-10-CM

## 2023-11-17 DIAGNOSIS — E041 Nontoxic single thyroid nodule: Secondary | ICD-10-CM | POA: Diagnosis not present

## 2023-11-17 DIAGNOSIS — F17210 Nicotine dependence, cigarettes, uncomplicated: Secondary | ICD-10-CM

## 2023-11-17 DIAGNOSIS — R490 Dysphonia: Secondary | ICD-10-CM | POA: Diagnosis not present

## 2023-11-17 DIAGNOSIS — R0981 Nasal congestion: Secondary | ICD-10-CM

## 2023-11-17 DIAGNOSIS — Z720 Tobacco use: Secondary | ICD-10-CM

## 2023-11-17 NOTE — Progress Notes (Signed)
 ENT CONSULT:  Reason for Consult: thyroid nodules dysphagia and dysphonia    HPI: Discussed the use of AI scribe software for clinical note transcription with the patient, who gave verbal consent to proceed.  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 She was referred by a GI specialist for evaluation of a thyroid nodule and swallowing problems, with latter thought to be related to non-esophageal causes per GI office visit documentation.  She has a history of a thyroid nodule identified during an ultrasound ordered by a GI specialist due to swallowing difficulties and hx of thyroid nodules. The nodule requires biopsy (left mid to lower thyroid pole). She has no prior history of thyroid disease, although she recalls being on medication for thyroid issues years ago, possibly related to a past diagnosis of Graves' disease, which she believes resolved without ongoing treatment. Used to see Dr Talmage Nap.   She reports progressive swallowing difficulties over the past five to six months, initially mild but now occurring almost daily. She experiences episodes of choking, sometimes severe enough to induce coughing, and notes that even small amounts of liquid can trigger these episodes. She has not undergone a swallow study previously.  In addition to swallowing issues, she has experienced voice changes over the past two months, describing her voice as 'very hoarse' and sometimes unable to produce more than a whisper. These changes occur at any time of day and are not associated with any specific triggers.  She is a current smoker, having reduced her intake from a pack a day to about half a pack due to lifestyle changes. She was hospitalized over Christmas for depression and has a history of using  nicotine patches during that time. She is in the process of moving to a senior living community, which has contributed to her reduced smoking habit.  She has a history of diabetes, and notes that ringing in the ears worsens with higher blood sugar levels.  Her past medical history includes a diagnosis of breast cancer 24 years ago, treated with mastectomy, reconstruction, chemotherapy, and radiation. She has not had recent follow-up with an oncologist but reports recent mammogram and chest X-ray results were reassuring.     Records Reviewed:  GI office visit 09/28/23 68 year old female with a history of thyroid nodules, colon polyps diabetes, breast cancer, here to establish care for dysphagia.   She reports dysphagia for the past month or so.  She localizes this to her throat/posterior pharynx.  She states swallowing anything such as pills or foods will be difficult to swallow and get stuck.  She states she has a hard time initiating the swallow to get the food into her esophagus.  Once she gets it past her throat she states it goes down into her stomach without a problem.  She also endorses a very raspy voice and voice change.  She feels as if she has "laryngitis all the time".  She eats well otherwise with a good appetite.  No nausea or vomiting.  She rarely has much of any reflux that bothers her.  She has not lost any weight.  She has a long term tobacco user for the past 30 years.   She had a thyroid ultrasound back in August 2019, multiple nodules noted, she was told to have a repeat ultrasound  in 1 year for surveillance of the larger nodules, she does not recall ever doing that.   She did have a colonoscopy with Dr. Dickie La in 2011, she had a piecemeal removal of a large polyp.  She denies any bowel troubles.  No blood in her stools.  No abdominal pains.  She states she had a colonoscopy with Dr. Loreta Ave since that time.  We reached out to get those records.  After she left the office they  arrived.  As outlined below.   Colonoscopy 01/01/2010 - Dr. Juanda Chance - FINDINGS:  A sessile polyp was found in the cecum. large 20mm     multilobulated polyp in the cecal pouch removed piecemeal Polyp     was snared, then cauterized with monopolar cautery. Retrieval was     successful (see image1 and image2). snare polyp  This was     otherwise a normal examination of the colon (see image5, image4,     and image3).   Retroflexed views in the rectum revealed no     abnormalities.    The scope was then withdrawn from the patient     and the procedure completed.   FINDINGS:  A sessile polyp was found in the cecum. large 20mm     multilobulated polyp in the cecal pouch removed piecemeal Polyp     was snared, then cauterized with monopolar cautery. Retrieval was     successful (see image1 and image2). snare polyp  This was     otherwise a normal examination of the colon (see image5, image4,     and image3).   Retroflexed views in the rectum revealed no     abnormalities.    The scope was then withdrawn from the patient     and the procedure completed.     Thyroid US: IMPRESSION: Thyroid enlargement with multinodular goiter. A 1.3 cm solid right mid nodule (nodule 2 above) and a 2.4 cm left superior nodule (nodule 6 above) both meet criteria for follow-up ultrasound in 1 year. All of the nodules are cystic throughout the thyroid and do not meet criteria for biopsy or further follow-up.  ASSESSMENT: 68 y.o. female here for assessment of the following   1. Oropharyngeal dysphagia   2. Change in voice   3. Thyroid nodule   4. Tobacco use disorder   5. History of colon polyps     Patient with what sounds like oropharyngeal dysphagia but significant change in her voice, with a history of thyroid nodule 6 years ago which have not been followed up upon as previously recommended.  She also has history of significant tobacco use.   Her symptoms today do not sound esophageal.  I think she needs  reassessment of her thyroid nodules to ensure no interval malignancy and an ENT evaluation in light of her voice changes and tobacco use to ensure no problem in her posterior pharynx.  I have ordered an ASAP ultrasound and referral to ENT.  If these exams are unrevealing or fail to show a clear cause for her symptoms then we can consider EGD but I think at least initially for today, perhaps less likely to show [place] cause for her symptoms.  She does not endorse much reflux, I doubt that is the cause of her change in voice.   Otherwise after she left her prior colonoscopy report arrived.  She has a history of advanced polyps and would repeat a colonoscopy in 5 years.   PLAN: - thyroid US ASAP -  reassess nodules, assess for malignancy - referral to ENT ASAP - voice change, thyroid nodules - needs laryngoscopy - if workup negative, can consider EGD but think less likely esophageal right now - repeat colonoscopy in 12/2024     Past Medical History:  Diagnosis Date   ADD (attention deficit disorder)    Breast cancer (HCC)    Chest pain    Depression    Diabetes mellitus without complication (HCC)    Dyslipidemia    Kidney stone    Sleep disturbance     Past Surgical History:  Procedure Laterality Date   MASTECTOMY  1999   Reconstructive Breast Surgery  2000,2010   TONSILLECTOMY  1974    Family History  Problem Relation Age of Onset   Hypertension Mother    Diabetes Mother    Hypertension Father    Colon cancer Neg Hx    Stomach cancer Neg Hx    Esophageal cancer Neg Hx     Social History:  reports that she has been smoking cigarettes. She does not have any smokeless tobacco history on file. She reports current alcohol use. She reports that she does not use drugs.  Allergies:  Allergies  Allergen Reactions   Morphine     REACTION: itching   Penicillins     REACTION: hives   Sulfonamide Derivatives     REACTION: itching, hives, looses eye sight    Medications: I have  reviewed the patient's current medications.  The PMH, PSH, Medications, Allergies, and SH were reviewed and updated.  ROS: Constitutional: Negative for fever, weight loss and weight gain. Cardiovascular: Negative for chest pain and dyspnea on exertion. Respiratory: Is not experiencing shortness of breath at rest. Gastrointestinal: Negative for nausea and vomiting. Neurological: Negative for headaches. Psychiatric: The patient is not nervous/anxious  Blood pressure 137/79, pulse 88, height 5\' 2"  (1.575 m), weight 135 lb (61.2 kg), SpO2 96%. Body mass index is 24.69 kg/m.  PHYSICAL EXAM:  Exam: General: Well-developed, well-nourished Communication and Voice: raspy Respiratory Respiratory effort: Equal inspiration and expiration without stridor Cardiovascular Peripheral Vascular: Warm extremities with equal color/perfusion Eyes: No nystagmus with equal extraocular motion bilaterally Neuro/Psych/Balance: Patient oriented to person, place, and time; Appropriate mood and affect; Gait is intact with no imbalance; Cranial nerves I-XII are intact Head and Face Inspection: Normocephalic and atraumatic without mass or lesion Palpation: Facial skeleton intact without bony stepoffs Salivary Glands: No mass or tenderness Facial Strength: Facial motility symmetric and full bilaterally ENT Pinna: External ear intact and fully developed External canal: Canal is patent with intact skin Tympanic Membrane: Clear and mobile External Nose: No scar or anatomic deformity Internal Nose: Septum is relatively straight on the left. No polyp, or purulence. Mucosal edema and erythema present.  Bilateral inferior turbinate hypertrophy.  Lips, Teeth, and gums: Mucosa and teeth intact and viable TMJ: No pain to palpation with full mobility Oral cavity/oropharynx: No erythema or exudate, no lesions present Nasopharynx: No mass or lesion with intact mucosa Hypopharynx: Intact mucosa without pooling of  secretions Larynx Glottic: L VF paralysis, normal mobility of the R VF without lesion or mass Supraglottic: Normal appearing epiglottis and AE folds Interarytenoid Space: Moderate pachydermia&edema Subglottic Space: Patent without lesion or edema Neck Neck and Trachea: Midline trachea without mass or lesion Thyroid: No mass or nodularity Lymphatics: No lymphadenopathy  Procedure: Preoperative diagnosis: dysphonia, dysphagia   Postoperative diagnosis:   Same + L VF paralysis   Procedure: Flexible fiberoptic laryngoscopy  Surgeon: Ashok Croon, MD  Anesthesia: Topical lidocaine and Afrin Complications: None Condition is stable throughout exam  Indications and consent:  The patient presents to the clinic with above symptoms. Indirect laryngoscopy view was incomplete. Thus it was recommended that they undergo a flexible fiberoptic laryngoscopy. All of the risks, benefits, and potential complications were reviewed with the patient preoperatively and verbal informed consent was obtained.  Procedure: The patient was seated upright in the clinic. Topical lidocaine and Afrin were applied to the nasal cavity. After adequate anesthesia had occurred, I then proceeded to pass the flexible telescope into the nasal cavity. The nasal cavity was patent without rhinorrhea or polyp. The nasopharynx was also patent without mass or lesion. The base of tongue was visualized and was normal. There were no signs of pooling of secretions in the piriform sinuses. The true vocal folds were mobile bilaterally. There were no signs of glottic or supraglottic mucosal lesion or mass. There was moderate interarytenoid pachydermia and post cricoid edema. The telescope was then slowly withdrawn and the patient tolerated the procedure throughout.   Studies Reviewed: Thyroid U/S 03/22/2018 IMPRESSION: Thyroid enlargement with multinodular goiter. A 1.3 cm solid right mid nodule (nodule 2 above) and a 2.4 cm left  superior nodule (nodule 6 above) both meet criteria for follow-up ultrasound in 1 year. All of the nodules are cystic throughout the thyroid and do not meet criteria for biopsy or further follow-up.  Thyroid U/S 09/28/23 FINDINGS: Parenchymal Echotexture: Moderately heterogenous   Isthmus: 0.7 cm   Right lobe: 5.3 x 2.3 x 1.8 cm   Left lobe: 5.1 x 3.2 x 2.8 cm   _________________________________________________________   Estimated total number of nodules >/= 1 cm: 6-10   Number of spongiform nodules >/=  2 cm not described below (TR1): 0   Number of mixed cystic and solid nodules >/= 1.5 cm not described below (TR2): 0   _________________________________________________________   Enlarged heterogeneous thyroid gland containing multiple thyroid cysts and nodules.   Nodule # 1: Minimally complex cystic nodule with colloid artifact consistent with benign colloid cyst in the superior aspect of the thyroid isthmus. This nodule does NOT meet TI-RADS criteria for biopsy or dedicated follow-up.   Nodule # 2: Simple cyst in the inferior aspect of the thyroid isthmus measures up to 1.6 cm. This nodule does NOT meet TI-RADS criteria for biopsy or dedicated follow-up.   Nodule # 3 and 4: Subcentimeter nodules in the right upper gland. No further follow-up.   Nodule # 5: Hypoechoic solid nodule in the right lower gland has minimally changed compared to prior imaging from 2019 and measures 1.5 x 0.8 x 1.0 cm. Greater than 5 years of stability is consistent with benignity. No further follow-up is recommended.   Nodule # 7: Anechoic cyst with peripheral colloid artifact in the left upper gland is considered benign. No further follow-up.   Nodule # 8: Spongiform nodule in the left mid gland. No further follow-up.   Nodule # 9: Predominantly cystic nodule in the left mid gland. No further follow-up.   Nodule # 10: Isoechoic predominantly solid nodule in the left mid to lower  gland has slightly enlarged compared to prior and measures 3.0 x 2.7 x 2.1 cm compared to 2.4 x 1.7 x 1.7 cm.   IMPRESSION: 1. Enlarging TI-RADS category 3 nodule in the left mid to lower gland now measures up to 3.0 cm. This lesion now meets criteria to consider fine-needle aspiration biopsy. Biopsy is recommended. 2. Multiple additional lesions either demonstrates  sonographically low risk/benign findings or stability for more than 5 years and do not meet criteria for either biopsy or imaging surveillance.  Assessment/Plan: Encounter Diagnoses  Name Primary?   Thyroid nodule Yes   Multinodular goiter    Dysphagia, unspecified type    Tobacco abuse    Dysphonia    Chronic GERD    Environmental and seasonal allergies    Post-nasal drip    Chronic nasal congestion    Vocal cord paralysis     Assessment and Plan Assessment & Plan Thyroid nodule and hx of multinodular goiter Larger nodule  left mid inferior pole 3 cm and TiRADs-3 meets criteria for FNA. Her scope exam showed L VF paralysis raising concern for thyroid cancer if related to thyroid nodule. Other potential explanation is L VF paralysis is unrelated to hx of thyroid nodule. - Order thyroid nodule biopsy. - imaging including CT neck and chest for workup of new diagnosis of L VF paralysis   Left vocal cord paralysis Paralysis on same side as thyroid nodule. Ddx includes neoplasm (including thyroid cancer vs other 2/2 hx of left breast cancer s/p surgery and chemo) vs idiopathic cause. Further imaging needed. - Order CT scan of neck and chest to evaluate entire course of L RLN  Dysphagia Swallowing difficulty may be related to vocal cord paralysis causing aspiration. Swallow study needed to evaluate function. - Order swallow study - MBS and esophagram  Breast cancer hx  History of left-sided breast cancer treated 24 years ago. Recent mammogram and chest X-ray done and reported to be reassuring. Monitoring for recurrence  or new malignancies is important. - will consider referral to Onc based on imaging results (CT chest)  Smoking Tobacco use d/o Tobacco use disorder.  We had an extensive discussion about detrimental effects of smoking on overall health. I provided resources available at [facility] to assist with smoking cessation. I spent 4 min on counseling  - Advised smoking cessation  F/u in 2 mo after testing: CT neck and chest FNA of thyroid nodule  MBS and esophagram     Thank you for allowing me to participate in the care of this patient. Please do not hesitate to contact me with any questions or concerns.   Ashok Croon, MD Otolaryngology [facility] Phone: [phone] Fax: [phone]    11/17/2023, 9:29 AM

## 2023-11-17 NOTE — Patient Instructions (Signed)
 Dear Weldon Inches,   Congratulations for your interest in quitting smoking!  Find a program that suits you best: when you want to quit, how you need support, where you live, and how you like to learn.    If you're ready to get started TODAY, consider scheduling a visit through [facility] [email or web address].  Appointments are available from 8am to 8pm, Monday to Friday.   Most health insurance plans will cover some level of tobacco cessation visits and medications.    Additional Resources: [employer] are also available to help you quit & provide the support you'll need. Many programs are available in both [place] and Spanish and have a long history of successfully helping people get off and stay off tobacco.    Quit Smoking Apps:  quitSTART at SeriousBroker.de QuitGuide?at ForgetParking.dk Online education and resources: Smokefree  at [employer].gov Free Telephone Coaching: QuitNow,  Call 1-800-QUIT-NOW (303-085-1236) or Text- Ready to (236)349-6579 *Quitline  has teamed up with Medicaid to offer a free 14 week program    Vaping- Want to Quit? Free 24/7 support. Call [facility]  [place], [place], [place], [place], [place]  [facility]

## 2023-11-21 ENCOUNTER — Other Ambulatory Visit (HOSPITAL_COMMUNITY): Payer: Self-pay | Admitting: *Deleted

## 2023-11-21 DIAGNOSIS — R1310 Dysphagia, unspecified: Secondary | ICD-10-CM

## 2023-11-21 DIAGNOSIS — R059 Cough, unspecified: Secondary | ICD-10-CM

## 2023-11-29 DIAGNOSIS — E042 Nontoxic multinodular goiter: Secondary | ICD-10-CM | POA: Diagnosis not present

## 2023-11-29 DIAGNOSIS — E1165 Type 2 diabetes mellitus with hyperglycemia: Secondary | ICD-10-CM | POA: Diagnosis not present

## 2023-12-04 ENCOUNTER — Ambulatory Visit
Admission: RE | Admit: 2023-12-04 | Discharge: 2023-12-04 | Disposition: A | Discharge status: Home / Self Care | Source: Ambulatory Visit | Attending: Otolaryngology

## 2023-12-04 ENCOUNTER — Other Ambulatory Visit (HOSPITAL_COMMUNITY): Payer: Self-pay | Admitting: Psychiatry

## 2023-12-04 ENCOUNTER — Other Ambulatory Visit (HOSPITAL_COMMUNITY)
Admission: RE | Admit: 2023-12-04 | Discharge: 2023-12-04 | Disposition: A | Discharge status: Home / Self Care | Source: Ambulatory Visit | Attending: Otolaryngology | Admitting: Otolaryngology

## 2023-12-04 DIAGNOSIS — E041 Nontoxic single thyroid nodule: Secondary | ICD-10-CM | POA: Insufficient documentation

## 2023-12-04 DIAGNOSIS — F99 Mental disorder, not otherwise specified: Secondary | ICD-10-CM

## 2023-12-06 ENCOUNTER — Telehealth (INDEPENDENT_AMBULATORY_CARE_PROVIDER_SITE_OTHER): Payer: Self-pay

## 2023-12-06 ENCOUNTER — Ambulatory Visit (INDEPENDENT_AMBULATORY_CARE_PROVIDER_SITE_OTHER): Admitting: Licensed Clinical Social Worker

## 2023-12-06 ENCOUNTER — Encounter (HOSPITAL_COMMUNITY): Payer: Self-pay

## 2023-12-06 ENCOUNTER — Encounter (HOSPITAL_COMMUNITY): Payer: Self-pay | Admitting: Licensed Clinical Social Worker

## 2023-12-06 DIAGNOSIS — Z0389 Encounter for observation for other suspected diseases and conditions ruled out: Secondary | ICD-10-CM

## 2023-12-06 LAB — CYTOLOGY - NON PAP

## 2023-12-06 NOTE — Telephone Encounter (Signed)
Spoke to patient and let her know the biopsy was benign.

## 2023-12-06 NOTE — Progress Notes (Signed)
 No show

## 2023-12-07 ENCOUNTER — Telehealth (HOSPITAL_COMMUNITY): Payer: Self-pay

## 2023-12-07 MED ORDER — ZALEPLON 5 MG PO CAPS: 5.0000 mg | ORAL_CAPSULE | Freq: Every evening | ORAL | 0 refills | Status: DC | PRN

## 2023-12-07 NOTE — Telephone Encounter (Signed)
 Discontinue Lunesta .  I sent prescription of Sonata  5 mg to take as needed for insomnia.  Number 20 pills sent to the [facility] pharmacy

## 2023-12-07 NOTE — Telephone Encounter (Signed)
 Per Dr. Arfeen, I called the pharmacy and discontinued the Ambien . They are getting the new prescription ready, I called the patient and let her know

## 2023-12-07 NOTE — Telephone Encounter (Signed)
 I received a call from a pharmacist at the patients insurance company - his name is Larinda Plover. He said that the Lunesta  you prescribed is not covered and he called the patient to discuss alternatives. Patient agreed to try Zaleplon  that is covered and Larinda Plover would like you to please send that to the pharmacy instead. Please review and advise, thank you

## 2023-12-08 ENCOUNTER — Ambulatory Visit (HOSPITAL_COMMUNITY)
Admission: RE | Admit: 2023-12-08 | Discharge: 2023-12-08 | Disposition: A | Discharge status: Home / Self Care | Source: Ambulatory Visit | Attending: Otolaryngology | Admitting: Otolaryngology

## 2023-12-08 ENCOUNTER — Ambulatory Visit (HOSPITAL_COMMUNITY)
Admission: RE | Admit: 2023-12-08 | Discharge: 2023-12-08 | Disposition: A | Discharge status: Home / Self Care | Source: Ambulatory Visit | Attending: Physician Assistant | Admitting: Physician Assistant

## 2023-12-08 DIAGNOSIS — R1310 Dysphagia, unspecified: Secondary | ICD-10-CM

## 2023-12-08 DIAGNOSIS — R490 Dysphonia: Secondary | ICD-10-CM | POA: Insufficient documentation

## 2023-12-08 DIAGNOSIS — R059 Cough, unspecified: Secondary | ICD-10-CM

## 2023-12-08 DIAGNOSIS — R1313 Dysphagia, pharyngeal phase: Secondary | ICD-10-CM | POA: Diagnosis not present

## 2023-12-08 DIAGNOSIS — K449 Diaphragmatic hernia without obstruction or gangrene: Secondary | ICD-10-CM | POA: Diagnosis not present

## 2023-12-08 DIAGNOSIS — K219 Gastro-esophageal reflux disease without esophagitis: Secondary | ICD-10-CM | POA: Diagnosis not present

## 2023-12-08 DIAGNOSIS — K2289 Other specified disease of esophagus: Secondary | ICD-10-CM | POA: Diagnosis not present

## 2023-12-08 NOTE — Evaluation (Signed)
 Modified Barium Swallow Study  Patient Details  Name: Lacey Perkins MRN: [id number] Date of Birth: 1956/04/11  Today's Date: 12/08/2023  Modified Barium Swallow completed.  Full report located under Chart Review in the Imaging Section.  History of Present Illness Lacey Perkins is a 68 year old female with hx of breast cancer, tobacco use disorder, thyroid  nodule and multinodular goiter who was referred for OP MBS by Dr. Liuba Soldatova.  Pt has had progressive swallowing difficulties for the last 5-6 months. She experiences episodes of choking, sometimes severe enough to induce coughing, and notes that even small amounts of liquid can trigger these episodes. In addition to swallowing issues, she has experienced voice changes over the past two months, describing her voice as 'very hoarse' and sometimes unable to produce more than a whisper. Pt underwent flexible fiberoptic laryngoscopy on 11/17/23; dx left vocal fold paralysis, same side as thyroid  nodule. Awaiting differential dx, neoplasm vs idiopathic cause.   Clinical Impression Pt presented with aspiration of thin liquids during the swallow, prevented effectively using a head turn to the left (compensating for left vocal fold paralysis). All other elements of the swallow were WNL - functional oral phase; adequate laryngeal mobility; normal pharyngeal squeeze with no residue. Pt viewed the study and we discussed results/recommendations. She should turn head to the left when drinking liquids; head neutral for solids.  Ms. Steva Ek verbalized understanding. Recommend f/u with Dr. Soldatova once all test results are in. Factors that may increase risk of adverse event in presence of aspiration Roderick Civatte & Jessy Morocco 2021):  (n/a)  Swallow Evaluation Recommendations Recommendations: PO diet PO Diet Recommendation: Regular;Thin liquids (Level 0) Liquid Administration via: Cup;Straw Medication Administration: Whole meds with  liquid Supervision: Patient able to self-feed Swallowing strategies  : Head turn left during swallowing Postural changes: Position pt fully upright for meals Oral care recommendations: Oral care BID (2x/day)    Shawnee Gambone L. Beatris Lincoln, MA CCC/SLP Clinical Specialist - Acute Care SLP Acute Rehabilitation Services Office number [phone]   Myna Asal Laurice 12/08/2023,1:25 PM

## 2023-12-15 ENCOUNTER — Other Ambulatory Visit

## 2023-12-18 DIAGNOSIS — R5383 Other fatigue: Secondary | ICD-10-CM | POA: Diagnosis not present

## 2023-12-18 DIAGNOSIS — J069 Acute upper respiratory infection, unspecified: Secondary | ICD-10-CM | POA: Diagnosis not present

## 2023-12-18 DIAGNOSIS — Z87891 Personal history of nicotine dependence: Secondary | ICD-10-CM | POA: Diagnosis not present

## 2023-12-18 DIAGNOSIS — E1169 Type 2 diabetes mellitus with other specified complication: Secondary | ICD-10-CM | POA: Diagnosis not present

## 2023-12-18 DIAGNOSIS — Z794 Long term (current) use of insulin: Secondary | ICD-10-CM | POA: Diagnosis not present

## 2023-12-26 ENCOUNTER — Telehealth (HOSPITAL_BASED_OUTPATIENT_CLINIC_OR_DEPARTMENT_OTHER): Admitting: Psychiatry

## 2023-12-26 ENCOUNTER — Encounter (HOSPITAL_COMMUNITY): Payer: Self-pay | Admitting: Psychiatry

## 2023-12-26 ENCOUNTER — Telehealth (HOSPITAL_COMMUNITY): Payer: Self-pay | Admitting: *Deleted

## 2023-12-26 VITALS — Wt 137.0 lb

## 2023-12-26 DIAGNOSIS — F902 Attention-deficit hyperactivity disorder, combined type: Secondary | ICD-10-CM

## 2023-12-26 DIAGNOSIS — F331 Major depressive disorder, recurrent, moderate: Secondary | ICD-10-CM | POA: Diagnosis not present

## 2023-12-26 DIAGNOSIS — F411 Generalized anxiety disorder: Secondary | ICD-10-CM

## 2023-12-26 DIAGNOSIS — F5105 Insomnia due to other mental disorder: Secondary | ICD-10-CM

## 2023-12-26 MED ORDER — ZALEPLON 5 MG PO CAPS: 5.0000 mg | ORAL_CAPSULE | Freq: Every evening | ORAL | 2 refills | Status: DC | PRN

## 2023-12-26 MED ORDER — REXULTI 0.5 MG PO TABS: 0.5000 mg | ORAL_TABLET | Freq: Every day | ORAL | 2 refills | Status: DC

## 2023-12-26 MED ORDER — BUPROPION HCL ER (XL) 300 MG PO TB24: 300.0000 mg | ORAL_TABLET | Freq: Every day | ORAL | 2 refills | Status: DC

## 2023-12-26 MED ORDER — ESCITALOPRAM OXALATE 20 MG PO TABS: 20.0000 mg | ORAL_TABLET | Freq: Every day | ORAL | 2 refills | Status: DC

## 2023-12-26 NOTE — Telephone Encounter (Signed)
 Lab results received from [facility] medical records as requested. These labs were drawn on 10/10/23.  BMP all WNL. GAD 65 AB WNL. HgbA1c HIGH at 11.0. C-Peptide Serum WNL. Results sent to [facility] to be scanned to chart.

## 2023-12-26 NOTE — Progress Notes (Signed)
Health MD Virtual Progress Note   Patient Location: [facility] Provider Location: Home Office  I connect with patient by video and verified that I am speaking with correct person by using two identifiers. I discussed the limitations of evaluation and management by telemedicine and the availability of in person appointments. I also discussed with the patient that there may be a patient responsible charge related to this service. The patient expressed understanding and agreed to proceed.  Lacey Perkins [id number] 68 y.o.  12/26/2023 10:39 AM  History of Present Illness:  Patient is evaluated by video session.  She now moved into a retirement facility at [facility] on April 3.  She did like this nucleus and trying to make friends.  However she is still struggling with lack of motivation to do things.  There are boxes that she had not open yet but denies any crying spells, feeling of hopelessness or worthlessness.  She denies any suicidal thoughts or any agitation.  She is not sure why she is not motivated but she is working to make more friends.  She had called few times our office will try sleep medicine.  She used to take trazodone  but that did not help.  We tried Lunesta  but insurance did not cover and now she is taking Sonata  5 mg.  She reported Sonata  help in at least giving 8 hours sleep but is plenty but wondering if she can get a cheaper medication as she has to pay $15 co-pay.  She is also taking the Lexapro , Rexulti  and Wellbutrin .  She has no tremors, shakes or any EPS.  She recently had a visit with her primary care and she was told her hemoglobin A1c is improved but we do not have the lab results.  Patient does go outside in some time feel good when she walks.  She denies any hallucination, paranoia, anger, irritability.  Her appetite is okay.  Her attention commendation is fair.  Patient has a good support from her sister-in-law who also helps cleaning  her previous place and moving to the new place.  Patient is hoping to put her place on the market very soon.  She has a visit with therapist and that did go well.  She has appointment coming up tomorrow.  Patient denies drinking or using any illegal substances.  Past Psychiatric History: H/O ADD, depression, anxiety most of life.  Saw Adline Aho at [facility].  Remember taking Ambien , Wellbutrin , Klonopin, trazodone  and Adderall.  No history of suicidal attempt but inpatient at [facility] in December 2024.  Discharged on trazodone , Lexapro , BuSpar  and Sinequan .  H/O break-ups , flashbacks and intrusive thoughts.  Restart trazodone  did not helped. No history of psychosis.  Lunesta  did not covered.   Outpatient Encounter Medications as of 12/26/2023  Medication Sig   Brexpiprazole  (REXULTI ) 0.5 MG TABS Take 1 tablet (0.5 mg total) by mouth daily.   buPROPion  (WELLBUTRIN  XL) 300 MG 24 hr tablet Take 1 tablet (300 mg total) by mouth daily.   escitalopram  (LEXAPRO ) 20 MG tablet Take 1 tablet (20 mg total) by mouth daily.   eszopiclone  (LUNESTA ) 2 MG TABS tablet Take 1 tablet (2 mg total) by mouth at bedtime as needed for sleep. Take immediately before bedtime   ibuprofen (ADVIL) 200 MG tablet Take 400-600 mg by mouth every 6 (six) hours as needed for mild pain (pain score 1-3) or moderate pain (pain score 4-6).   insulin  aspart protamine- aspart (  NOVOLOG  MIX 70/30) (70-30) 100 UNIT/ML injection Inject 0.1 mLs (10 Units total) into the skin 2 (two) times daily with a meal.   Insulin  Syringes, Disposable, (B-D INSULIN  SYRINGE 1CC) U-100 1 ML MISC 1 each by Does not apply route in the morning and at bedtime.   zaleplon  (SONATA ) 5 MG capsule Take 1 capsule (5 mg total) by mouth at bedtime as needed for sleep.   No facility-administered encounter medications on file as of 12/26/2023.    Recent Results (from the past 2160 hours)  Cytology - Non PAP; LEFT THYROID  LOBE      Status: None   Collection Time: 12/04/23  9:45 AM  Result Value Ref Range   CYTOLOGY - NON GYN      CYTOLOGY - NON PAP CASE: [id number] [name] Non-Gynecological Cytology Report     Clinical History: Nodule # 10: Isoechoic predominantly solid nodule in the left mid to lower gland has slightly enlarged compared to prior and measures 3.0 x 2.7 x 2.1 cm compared to 2.4 x 1.7 x 1.7 cm. Specimen Submitted:  A. THYROID , LT MID, FINE NEEDLE ASPIRATION   FINAL MICROSCOPIC DIAGNOSIS: - Benign follicular nodule (Bethesda category II)  SPECIMEN ADEQUACY: Satisfactory for evaluation  GROSS: Received is/are 30 cc's of clear, peach cytolyt solution. (EMH:emh) Prepared: Smears:  0 Concentration Method (Thin Prep):  1 Cell Block: Cell block attempted, not obtained. Additional Studies: Afirma collected.     Final Diagnosis performed by Dillard Frame, MD.   Electronically signed 12/06/2023 Technical component performed at [facility]. [facility], [address] [place], [place] [address].  Professional component performed at [facility]. [address] [place], [place] [address]  Immunohistochemistry Technical component (if applicable) was performed at [facility] [employer] [facility]. [address], [place], [place] [address].  IMMUNOHISTOCHEMISTRY DISCLAIMER (if applicable): Some of these immunohistochemical stains may have been developed and the performance characteristics determine by [facility]. Some may not have been cleared or approved by the U.S. Food and Drug Administration. The FDA has determined that such clearance or approval is not necessary. This test is used for clinical purposes. It should not be regarded as investigational or for research. This laboratory is certified under the Clinical Laboratory Improvement Amendments of 1988 (CLIA-88) as qualified to perform high complexity  clinical laboratory testing.  The controls stained appropriately.      Psychiatric Specialty Exam: Physical Exam  Review of Systems  Weight 137 lb (62.1 kg).There is no height or weight on file to calculate BMI.  General Appearance: Casual  Eye Contact:  Good  Speech:  Clear and Coherent and Normal Rate  Volume:  Normal  Mood:  tired  Affect:  Congruent  Thought Process:  Goal Directed  Orientation:  Full (Time, Place, and Person)  Thought Content:  WDL  Suicidal Thoughts:  No  Homicidal Thoughts:  No  Memory:  Immediate;   Good Recent;   Good Remote;   Good  Judgement:  Intact  Insight:  Present  Psychomotor Activity:  Decreased  Concentration:  Concentration: Fair and Attention Span: Fair  Recall:  Good  Fund of Knowledge:  Good  Language:  Good  Akathisia:  No  Handed:  Right  AIMS (if indicated):     Assets:  Communication Skills Desire for Improvement Housing Social Support Transportation  ADL's:  Intact  Cognition:  WNL  Sleep:  8 hrs with med       10/11/2023  11:41 AM 09/18/2023    1:27 PM 08/25/2023    9:45 AM  Depression screen PHQ 2/9  Decreased Interest 2 1 1   Down, Depressed, Hopeless 1 1 2   PHQ - 2 Score 3 2 3   Altered sleeping 1 1 3   Tired, decreased energy 1 1 2   Change in appetite 0 1 0  Feeling bad or failure about yourself  0 0 1  Trouble concentrating 1 0 3  Moving slowly or fidgety/restless 1 0 3  Suicidal thoughts 0 0 0  PHQ-9 Score 7 5 15   Difficult doing work/chores Somewhat difficult Somewhat difficult Somewhat difficult    Assessment/Plan: Moderate episode of recurrent major depressive disorder (HCC) - Plan: buPROPion  (WELLBUTRIN  XL) 300 MG 24 hr tablet, Brexpiprazole  (REXULTI ) 0.5 MG TABS, escitalopram  (LEXAPRO ) 20 MG tablet  Generalized anxiety disorder - Plan: buPROPion  (WELLBUTRIN  XL) 300 MG 24 hr tablet, Brexpiprazole  (REXULTI ) 0.5 MG TABS, escitalopram  (LEXAPRO ) 20 MG tablet  Attention deficit hyperactivity disorder  (ADHD), combined type - Plan: buPROPion  (WELLBUTRIN  XL) 300 MG 24 hr tablet  Insomnia due to other mental disorder - Plan: zaleplon  (SONATA ) 5 MG capsule  Review current medication and psychosocial's.  Patient is now moved to a retirement facility and trying to make friends so she did not have any problem but still struggling with lack of motivation.  She is taking Wellbutrin , Lexapro  and Rexulti  and now Sonata  5 mg.  I discussed sleep study but patient does not feel she needed at this time has sleep is better with Sonata  but believes she can have a cheaper medication.  I explained Lunesta  did not covered by her insurance.  She had tried generic doxepin , trazodone  but they were not helpful.  Encouraged keep the therapy appointment with Camilo Cella.  Continue Wellbutrin  XL 300 mg daily, Lexapro  20 mg daily and Sonata  5 mg as needed for insomnia and Rexulti  0.5 mg.  We discussed hypnotics dependency, withdrawal.  We will get records from her PCP as patient recently had labs and hemoglobin A1c.  Discussed medication side effects and benefits.  Recommend to call us  back if you have any question or any concern.  Follow-up in 3 months.   Follow Up Instructions:     I discussed the assessment and treatment plan with the patient. The patient was provided an opportunity to ask questions and all were answered. The patient agreed with the plan and demonstrated an understanding of the instructions.   The patient was advised to call back or seek an in-person evaluation if the symptoms worsen or if the condition fails to improve as anticipated.    Collaboration of Care: Other provider involved in patient's care AEB notes are available in epic to review  Patient/Guardian was advised Release of Information must be obtained prior to any record release in order to collaborate their care with an outside provider. Patient/Guardian was advised if they have not already done so to contact the registration department to sign  all necessary forms in order for us  to release information regarding their care.   Consent: Patient/Guardian gives verbal consent for treatment and assignment of benefits for services provided during this visit. Patient/Guardian expressed understanding and agreed to proceed.     Total encounter time 29 minutes which includes face-to-face time, chart reviewed, care coordination, order entry and documentation during this encounter.   Note: This document was prepared by [employer] voice dictation technology and any errors that results from this process are unintentional.    Arturo Late, MD  12/26/2023   

## 2023-12-26 NOTE — Telephone Encounter (Signed)
 LVM for Lacey Perkins in medical records at [facility] requesting most recent lab work results be faxed to our office.

## 2023-12-27 ENCOUNTER — Ambulatory Visit (HOSPITAL_COMMUNITY): Admitting: Licensed Clinical Social Worker

## 2023-12-27 ENCOUNTER — Encounter (HOSPITAL_COMMUNITY): Payer: Self-pay | Admitting: Licensed Clinical Social Worker

## 2023-12-27 ENCOUNTER — Encounter (HOSPITAL_COMMUNITY): Payer: Self-pay

## 2023-12-27 DIAGNOSIS — E1165 Type 2 diabetes mellitus with hyperglycemia: Secondary | ICD-10-CM | POA: Diagnosis not present

## 2023-12-27 DIAGNOSIS — F902 Attention-deficit hyperactivity disorder, combined type: Secondary | ICD-10-CM | POA: Diagnosis not present

## 2023-12-27 DIAGNOSIS — F331 Major depressive disorder, recurrent, moderate: Secondary | ICD-10-CM

## 2023-12-27 NOTE — Progress Notes (Signed)
 Virtual Visit via Video Note  I connected with Lacey Perkins on 12/28/23 at 11:00 AM EDT by a video enabled telemedicine application and verified that I am speaking with the correct person using two identifiers.  Location: Patient: home Provider: home office   I discussed the limitations of evaluation and management by telemedicine and the availability of in person appointments. The patient expressed understanding and agreed to proceed.   I discussed the assessment and treatment plan with the patient. The patient was provided an opportunity to ask questions and all were answered. The patient agreed with the plan and demonstrated an understanding of the instructions.   The patient was advised to call back or seek an in-person evaluation if the symptoms worsen or if the condition fails to improve as anticipated.  I provided 60 minutes of non-face-to-face time during this encounter.   Seldon Dago, LCSW   Comprehensive Clinical Assessment (CCA) Note  12/27/2023 Lacey Perkins [id number]  Chief Complaint: depression, lack of motivation Visit Diagnosis: ADHD combined type, MDD recurrent, moderate   CCA Biopsychosocial Intake/Chief Complaint:  Living in [place]. Very happy to live there, enjoys making new friends. Has a dog and enjoys spending time outside. Moved in April 2025.  Current Symptoms/Problems: Used to take Adderall for ADHD, was on it for 25 years. Feels okay, but not motivated to do anything. Has not been able to get back on medication for ADHD. Missing link is the ADHD Medication. In the past year or two stopped taking medication for everything. Hospitalized in December for depression for 12 days. Having trouble with diabetes at this time. Had been agorphobic in her home. Depression has stabilized, but now feeling so unmotivated, has increased depressed mood.   Patient Reported Schizophrenia/Schizoaffective Diagnosis in  Past: No   Strengths: not much. Social, enjoys being with people.  Preferences: arts and crafts, listening to books- 1 book a day. spending time with dog.  Abilities: driving, listens to books, social   Type of Services Patient Feels are Needed: OPT, Meds with Dr. Arfeen. Needs updated ADHD evaluation   Initial Clinical Notes/Concerns: No data recorded  Mental Health Symptoms Depression:  Change in energy/activity; Difficulty Concentrating; Fatigue; Hopelessness; Increase/decrease in appetite; Weight gain/loss   Duration of Depressive symptoms: Greater than two weeks   Mania:  None   Anxiety:   -- (some physical sxs of anxiety)   Psychosis:  None   Duration of Psychotic symptoms: No data recorded  Trauma:  None   Obsessions:  None   Compulsions:  None   Inattention:  Avoids/dislikes activities that require focus; Disorganized; Does not follow instructions (not oppositional); Does not seem to listen; Fails to pay attention/makes careless mistakes; Forgetful; Loses things; Poor follow-through on tasks; Symptoms present in 2 or more settings; Symptoms before age 43   Hyperactivity/Impulsivity:  Blurts out answers; Difficulty waiting turn; Feeling of restlessness; Fidgets with hands/feet; Several symptoms present in 2 of more settings; Talks excessively   Oppositional/Defiant Behaviors:  Argumentative; Easily annoyed; Defies rules   Emotional Irregularity:  Intense/unstable relationships; Recurrent suicidal behaviors/gestures/threats   Other Mood/Personality Symptoms:  low mood overall, no significant lability    Mental Status Exam Appearance and self-care  Stature:  Average   Weight:  Average weight   Clothing:  Neat/clean   Grooming:  Well-groomed   Cosmetic use:  Age appropriate   Posture/gait:  Normal   Motor activity:  Not Remarkable   Sensorium  Attention:  Normal   Concentration:  Normal   Orientation:  X5   Recall/memory:  Normal   Affect and  Mood  Affect:  Appropriate   Mood:  Euthymic   Relating  Eye contact:  Normal   Facial expression:  Responsive   Attitude toward examiner:  Cooperative   Thought and Language  Speech flow: Loud; Pressured   Thought content:  Appropriate to Mood and Circumstances   Preoccupation:  None   Hallucinations:  None   Organization:  No data recorded  [employer] of Knowledge:  Average   Intelligence:  Average   Abstraction:  Normal   Judgement:  Good   Reality Testing:  Realistic   Insight:  Good   Decision Making:  Normal   Social Functioning  Social Maturity:  Responsible   Social Judgement:  Normal   Stress  Stressors:  Transitions   Coping Ability:  Normal   Skill Deficits:  None   Supports:  Friends/Service system; Family (sister in law, ex-boyfriend talks on the phone)     Religion: Religion/Spirituality Are You A Religious Person?: Yes What is Your Religious Affiliation?: Christian How Might This Affect Treatment?: keeps her from becoming suicidal  Leisure/Recreation: Leisure / Recreation Do You Have Hobbies?: Yes Leisure and Hobbies: likes to listen to books on audible, playing with dog  Exercise/Diet: Exercise/Diet Do You Exercise?: Yes What Type of Exercise Do You Do?: Run/Walk (walks dog daily) How Many Times a Week Do You Exercise?: Daily Have You Gained or Lost A Significant Amount of Weight in the Past Six Months?: Yes-Gained Number of Pounds Gained: 15 Do You Follow a Special Diet?: Yes Type of Diet: diabetic Do You Have Any Trouble Sleeping?: Yes Explanation of Sleeping Difficulties: takes meds which are helpful   CCA Employment/Education Employment/Work Situation: Employment / Work Situation Employment Situation: Retired Passenger transport manager has Been Impacted by Current Illness: No What is the Longest Time Patient has Held a Job?: [employer] from [phone] Where was the Patient Employed at that Time?: [employer] Has Patient ever Been in the 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, advertising Did You Have Any Special Interests In School?: no Did You Have An Individualized Education Program (IIEP): No Did You Have Any Difficulty At School?: No Patient's Education Has Been Impacted by Current Illness: No   CCA Family/Childhood History Family and Relationship History: Family history Marital status: Single Are you sexually active?: No What is your sexual orientation?: heterosexual Has your sexual activity been affected by drugs, alcohol, medication, or emotional stress?: no Does patient have children?: Yes How many children?: 3 How is patient's relationship with their children?: 3 boys and 4 grandchildren. One son in [place], one in [place], and one in [place]. Good relationship, but does not see them much  Childhood History:  Childhood History By whom was/is the patient raised?: Both parents Additional childhood history information: good Description of patient's relationship with caregiver when they were a child: good relationship with parents Patient's description of current relationship with people who raised him/her: parents are still alive, 86 and 42 years old. [place] are still healthy How were you disciplined when you got in trouble as a child/adolescent?: spankings with a belt Does patient  have siblings?: Yes Number of Siblings: 1 Description of patient's current relationship with siblings: 1 brother, good relationship, close with sister in law. Sister in law has become a massive support and is now POA Did patient suffer any verbal/emotional/physical/sexual abuse as a child?: No Did  patient suffer from severe childhood neglect?: No Has patient ever been sexually abused/assaulted/raped as an adolescent or adult?: No Was the patient ever a victim of a crime or a disaster?: No Witnessed domestic violence?: No Has patient been affected by domestic violence as an adult?: No  Child/Adolescent Assessment:     CCA Substance Use Alcohol/Drug Use: Alcohol / Drug Use Pain Medications: see MAR Prescriptions: see MAR Over the Counter: see MAR History of alcohol / drug use?: No history of alcohol / drug abuse                         ASAM's:  Six Dimensions of Multidimensional Assessment  Dimension 1:  Acute Intoxication and/or Withdrawal Potential:      Dimension 2:  Biomedical Conditions and Complications:      Dimension 3:  Emotional, Behavioral, or Cognitive Conditions and Complications:     Dimension 4:  Readiness to Change:     Dimension 5:  Relapse, Continued use, or Continued Problem Potential:     Dimension 6:  Recovery/Living Environment:     ASAM Severity Score:    ASAM Recommended Level of Treatment:     Substance use Disorder (SUD)    Recommendations for Services/Supports/Treatments: Recommendations for Services/Supports/Treatments Recommendations For Services/Supports/Treatments: Individual Therapy, Medication Management  DSM5 Diagnoses: Patient Active Problem List   Diagnosis Date Noted   Generalized anxiety disorder 08/25/2023   Insomnia due to other mental disorder 08/25/2023   History of ADHD 08/25/2023   Depression 12/31/2015    Patient Centered Plan: Patient is on the following Treatment Plan(s):  Depression and ADHD   Referrals to Alternative Service(s): Referred to Alternative Service(s):  ADHD eval Place:  [facility] Date:   Time:    Referred to Alternative Service(s):   Place:   Date:   Time:    Referred to Alternative Service(s):   Place:   Date:   Time:    Referred to Alternative Service(s):    Place:   Date:   Time:      Collaboration of Care: Psychiatrist AEB reviewed note from Dr. Carlos Chesterfield  Patient/Guardian was advised Release of Information must be obtained prior to any record release in order to collaborate their care with an outside provider. Patient/Guardian was advised if they have not already done so to contact the registration department to sign all necessary forms in order for us  to release information regarding their care.   Consent: Patient/Guardian gives verbal consent for treatment and assignment of benefits for services provided during this visit. Patient/Guardian expressed understanding and agreed to proceed.   Seldon Dago, LCSW

## 2024-01-17 ENCOUNTER — Ambulatory Visit (INDEPENDENT_AMBULATORY_CARE_PROVIDER_SITE_OTHER): Admitting: Otolaryngology

## 2024-01-18 ENCOUNTER — Encounter (HOSPITAL_COMMUNITY): Payer: Self-pay | Admitting: Licensed Clinical Social Worker

## 2024-01-18 ENCOUNTER — Ambulatory Visit (INDEPENDENT_AMBULATORY_CARE_PROVIDER_SITE_OTHER): Admitting: Licensed Clinical Social Worker

## 2024-01-18 DIAGNOSIS — F331 Major depressive disorder, recurrent, moderate: Secondary | ICD-10-CM | POA: Diagnosis not present

## 2024-01-18 DIAGNOSIS — F902 Attention-deficit hyperactivity disorder, combined type: Secondary | ICD-10-CM | POA: Diagnosis not present

## 2024-01-18 NOTE — Progress Notes (Signed)
 Virtual Visit via Video Note  I connected with Lacey Perkins on 01/18/24 at  4:30 PM EDT by a video enabled telemedicine application and verified that I am speaking with the correct person using two identifiers.  Location: Patient: home Provider: home office   I discussed the limitations of evaluation and management by telemedicine and the availability of in person appointments. The patient expressed understanding and agreed to proceed.   I discussed the assessment and treatment plan with the patient. The patient was provided an opportunity to ask questions and all were answered. The patient agreed with the plan and demonstrated an understanding of the instructions.   The patient was advised to call back or seek an in-person evaluation if the symptoms worsen or if the condition fails to improve as anticipated.  I provided 45 minutes of non-face-to-face time during this encounter.   Lacey Dago, LCSW   THERAPIST PROGRESS NOTE  Session Time: 4:30pm-5:15pm  Participation Level: Active  Behavioral Response: NeatAlert and LethargicDepressed  Type of Therapy: Individual Therapy  Treatment Goals addressed:  Problem: ADHD     Dates: Start:  12/27/23       Disciplines: Interdisciplinary, PROVIDER        Goal: LTG: Lacey Perkins's symptoms will exhibit functional improvement in activities of daily living     Dates: Start:  12/27/23    Expected End:  06/28/24       Disciplines: Interdisciplinary, PROVIDER             Goal: LTG: Lacey Perkins will demonstrate pro-social skills while interacting with therapist, family and/or the group     Dates: Start:  12/27/23    Expected End:  06/28/24       Disciplines: Interdisciplinary, PROVIDER             Goal: LTG: Lacey Perkins will demonstrate increased independent task completion     Dates: Start:  12/27/23    Expected End:  06/28/24       Disciplines: Interdisciplinary, PROVIDER         ProgressTowards Goals: Not  Progressing  Interventions: Motivational Interviewing  Summary: Lacey Perkins is a 68 y.o. female who presents with ADHD and MDD recurrent, moderate.   Suicidal/Homicidal: Nowithout intent/plan  Therapist Response: Lacey Perkins engaged well in session. Clinician utilized MI OARS to reflect and summarize thoughts, feelings, and interactions. Clinician explored mood and energy levels. Lacey Perkins shared that she continues to have no motivation to do much of anything. She is not interested in really socializing, cleaning her apartment, or doing much other than sleeping. Clinician explored daily routine and offered options for increasing time moving her body, rather than resting. Clinician explored available options for group exercise classes, entertainment, and social time. Lacey Perkins shared that she tends to return home and lay down after walking the dog, eating a meal. She has not been motivated to finish unpacking and she seems not to be terribly bothered. Clinician noted the use of "should" in conversation. Clinician offered "might as well" as an alternative to change perspective on the level of difficulty of these tasks. Clinician also explored options for having someone as an accountability partner in completing tasks.   Plan: Return again in 4 weeks. Wants referral for new ADHD evaluation to get back on Adderall. She believes that this will help her regain her energy and motivation.   Diagnosis: Attention deficit hyperactivity disorder (ADHD), combined type  Moderate episode of recurrent major depressive disorder (HCC)  Collaboration of Care: Psychiatrist AEB notified Dr. Carlos Chesterfield of  this request  Patient/Guardian was advised Release of Information must be obtained prior to any record release in order to collaborate their care with an outside provider. Patient/Guardian was advised if they have not already done so to contact the registration department to sign all necessary forms in order for us   to release information regarding their care.   Consent: Patient/Guardian gives verbal consent for treatment and assignment of benefits for services provided during this visit. Patient/Guardian expressed understanding and agreed to proceed.   Lacey [place], LCSW 01/18/2024

## 2024-01-24 DIAGNOSIS — E1165 Type 2 diabetes mellitus with hyperglycemia: Secondary | ICD-10-CM | POA: Diagnosis not present

## 2024-01-24 DIAGNOSIS — Z9641 Presence of insulin pump (external) (internal): Secondary | ICD-10-CM | POA: Diagnosis not present

## 2024-01-24 DIAGNOSIS — E042 Nontoxic multinodular goiter: Secondary | ICD-10-CM | POA: Diagnosis not present

## 2024-01-24 DIAGNOSIS — G609 Hereditary and idiopathic neuropathy, unspecified: Secondary | ICD-10-CM | POA: Diagnosis not present

## 2024-02-05 ENCOUNTER — Encounter: Payer: Self-pay | Admitting: Nurse Practitioner

## 2024-02-14 ENCOUNTER — Encounter (HOSPITAL_COMMUNITY): Payer: Self-pay | Admitting: Licensed Clinical Social Worker

## 2024-02-14 ENCOUNTER — Ambulatory Visit (INDEPENDENT_AMBULATORY_CARE_PROVIDER_SITE_OTHER): Admitting: Licensed Clinical Social Worker

## 2024-02-14 DIAGNOSIS — F331 Major depressive disorder, recurrent, moderate: Secondary | ICD-10-CM | POA: Diagnosis not present

## 2024-02-14 DIAGNOSIS — F902 Attention-deficit hyperactivity disorder, combined type: Secondary | ICD-10-CM | POA: Diagnosis not present

## 2024-02-14 DIAGNOSIS — F411 Generalized anxiety disorder: Secondary | ICD-10-CM | POA: Diagnosis not present

## 2024-02-14 NOTE — Progress Notes (Signed)
 Virtual Visit via Video Note  I connected with Lacey Perkins on 02/14/24 at  2:30 PM EDT by a video enabled telemedicine application and verified that I am speaking with the correct person using two identifiers.  Location: Patient: home Provider: home office   I discussed the limitations of evaluation and management by telemedicine and the availability of in person appointments. The patient expressed understanding and agreed to proceed.   I discussed the assessment and treatment plan with the patient. The patient was provided an opportunity to ask questions and all were answered. The patient agreed with the plan and demonstrated an understanding of the instructions.   The patient was advised to call back or seek an in-person evaluation if the symptoms worsen or if the condition fails to improve as anticipated.  I provided 45 minutes of non-face-to-face time during this encounter.   Lacey JONELLE Rosser, LCSW   THERAPIST PROGRESS NOTE  Session Time: 2:30pm-3:15pm  Participation Level: Active  Behavioral Response: CasualAlertDepressed and Irritable  Type of Therapy: Individual Therapy  Treatment Goals addressed:      Problem: ADHD     Dates: Start:  12/27/23       Disciplines: Interdisciplinary, PROVIDER           Goal: LTG: Lacey Perkins's symptoms will exhibit functional improvement in activities of daily living     Dates: Start:  12/27/23    Expected End:  06/28/24       Disciplines: Interdisciplinary, PROVIDER                Goal: LTG: Lacey Perkins will demonstrate pro-social skills while interacting with therapist, family and/or the group     Dates: Start:  12/27/23    Expected End:  06/28/24       Disciplines: Interdisciplinary, PROVIDER                Goal: LTG: Lacey will demonstrate increased independent task completion     Dates: Start:  12/27/23    Expected End:  06/28/24       Disciplines: Interdisciplinary, PROVIDER      ProgressTowards Goals: Not  Progressing  Interventions: Motivational Interviewing  Summary: Lacey Perkins is a 68 y.o. female who presents with ADHD and MDD, recurrent, moderate, GAD.   Suicidal/Homicidal: Nowithout intent/plan  Therapist Response: Lacey engaged in individual virtual session with Facilities manager.  Clinician utilized motivational interviewing to explore interactions and motivation to complete unpacking in her new apartment.  Clinician explored challenges in getting tasks completed due to not having ADHD medication.  Clinician explored progress in getting referrals for testing.  Clinician submitted referral information in session to [facility].  This referral was denied due to being out of network with insurance.  Clinician also provided information for [facility].  Lacey Perkins shared frustration that she is unable to get medication to treat her ADHD.  She shared she has been taking Adderall for many years and she is upset that she now has to go through an evaluation to get this medication.  Clinician validated frustration and communicated with Dr. Curry and nurse about options for med management and for further testing that is in network with her insurance.  Plan: Nurse will refer for further testing.  Dr. Arfeen may offer nonstimulant ADHD medication at next appointment.  Diagnosis: Attention deficit hyperactivity disorder (ADHD), combined type  Moderate episode of recurrent major depressive disorder (HCC)  Generalized anxiety disorder  Collaboration of Care: Psychiatrist AEB communicated with Dr. Arfeen and his nurse  about referral and med management.  Patient/Guardian was advised Release of Information must be obtained prior to any record release in order to collaborate their care with an outside provider. Patient/Guardian was advised if they have not already done so to contact the registration department to sign all necessary forms in order for us  to  release information regarding their care.   Consent: Patient/Guardian gives verbal consent for treatment and assignment of benefits for services provided during this visit. Patient/Guardian expressed understanding and agreed to proceed.   Lacey SAUNDERS [place], LCSW 02/14/2024

## 2024-02-15 ENCOUNTER — Telehealth (HOSPITAL_COMMUNITY): Payer: Self-pay

## 2024-02-15 ENCOUNTER — Other Ambulatory Visit (HOSPITAL_COMMUNITY): Payer: Self-pay

## 2024-02-15 MED ORDER — ATOMOXETINE HCL 25 MG PO CAPS: 25.0000 mg | ORAL_CAPSULE | Freq: Every day | ORAL | 0 refills | Status: DC

## 2024-02-15 NOTE — Telephone Encounter (Signed)
 Prescription of Strattera 25 mg #30 x 0 refill was sent to the pharmacy

## 2024-02-15 NOTE — Telephone Encounter (Signed)
 We can start Strattera 25 mg daily.  We will need neuropsychological testing if she wants to go back to Adderall.

## 2024-02-15 NOTE — Telephone Encounter (Signed)
 I spoke with the patient after receiving a message from her therapist. Patient stated she needs her Adderall back but she said she is fine trying Strattera, she said she will try anything at this point. Please review and advise, thank you

## 2024-03-11 ENCOUNTER — Other Ambulatory Visit (HOSPITAL_COMMUNITY): Payer: Self-pay | Admitting: Psychiatry

## 2024-03-20 ENCOUNTER — Other Ambulatory Visit (HOSPITAL_COMMUNITY): Payer: Self-pay | Admitting: Psychiatry

## 2024-03-20 DIAGNOSIS — F5105 Insomnia due to other mental disorder: Secondary | ICD-10-CM

## 2024-03-27 ENCOUNTER — Telehealth (HOSPITAL_COMMUNITY): Admitting: Psychiatry

## 2024-03-27 ENCOUNTER — Encounter (HOSPITAL_COMMUNITY): Payer: Self-pay | Admitting: Psychiatry

## 2024-03-27 ENCOUNTER — Telehealth (HOSPITAL_COMMUNITY): Payer: Self-pay | Admitting: *Deleted

## 2024-03-27 VITALS — Wt 162.0 lb

## 2024-03-27 DIAGNOSIS — F411 Generalized anxiety disorder: Secondary | ICD-10-CM

## 2024-03-27 DIAGNOSIS — F331 Major depressive disorder, recurrent, moderate: Secondary | ICD-10-CM | POA: Diagnosis not present

## 2024-03-27 DIAGNOSIS — F5105 Insomnia due to other mental disorder: Secondary | ICD-10-CM | POA: Diagnosis not present

## 2024-03-27 DIAGNOSIS — F902 Attention-deficit hyperactivity disorder, combined type: Secondary | ICD-10-CM

## 2024-03-27 MED ORDER — ESCITALOPRAM OXALATE 20 MG PO TABS: 20.0000 mg | ORAL_TABLET | Freq: Every day | ORAL | 1 refills | Status: DC

## 2024-03-27 MED ORDER — QUETIAPINE FUMARATE 50 MG PO TABS: 50.0000 mg | ORAL_TABLET | Freq: Every day | ORAL | 1 refills | Status: DC

## 2024-03-27 MED ORDER — BUPROPION HCL ER (XL) 300 MG PO TB24: 300.0000 mg | ORAL_TABLET | Freq: Every day | ORAL | 1 refills | Status: DC

## 2024-03-27 MED ORDER — LISDEXAMFETAMINE DIMESYLATE 20 MG PO CAPS: 20.0000 mg | ORAL_CAPSULE | Freq: Every day | ORAL | 0 refills | Status: DC

## 2024-03-27 NOTE — Telephone Encounter (Signed)
 Pt advised that referrals for ADHD testing have been sent to [facility], [facility], and [facility]. Pt verbalizes understanding. Pt also advised that writer attempted to run PA for the Seroquel  50 mg but cannot complete as showing Member Not Found with pt Medicare ID. Pt says she was going to pay out of pocket anyway as it is only $9. Pt had no questions or concerns.

## 2024-03-27 NOTE — Progress Notes (Signed)
 [facility] MD Virtual Progress Note   Patient Location: Home Provider Location: Home Office  I connect with patient by video and verified that I am speaking with correct person by using two identifiers. I discussed the limitations of evaluation and management by telemedicine and the availability of in person appointments. I also discussed with the patient that there may be a patient responsible charge related to this service. The patient expressed understanding and agreed to proceed.  Lacey Perkins [id number] 68 y.o.  03/27/2024 10:31 AM  History of Present Illness:  Patient is evaluated by video session.  She appears frustrated because still feel the same.  She has no motivation to do things.  She feels sad, depressed, hopeless and unmotivated to do things.  She like to go back on Adderall.  We have emphasized about psychological testing but patient has not done because of cost.  She also stopped taking Rexulti  as she cannot afford.  She also stopped Strattera  because did not see any improvement.  Patient like to go back on the last time because she feel so not is more expensive than Lunesta .  She is not sleeping well.  She reported financial stress and looking for cheaper alternatives.  She is taking Lexapro  and Wellbutrin .  Today she appears more frustrated and irritable.  Attention concentration is fair.  She struggled with multitasking.  Patient told she was taking the stimulants for more than 20 years until now.  Patient was admitted in December 2024 at [facility] because of depression.  She denies any hallucination, paranoia about ruminative thoughts.  She does not leave the house.  She had a visit with her endocrinologist and her hemoglobin A1c 11.  She is on multiple medication to help with blood sugar.  She has a good support from her sister unremarkable helps her when she needed.  She had a visit with a therapist but again not sure if therapy helping.  She may not  continue.  Patient told that she was under the impression that therapist will prescribe the Adderall.  Patient denies drinking or using any illegal substances.  Past Psychiatric History: H/O ADD, depression, anxiety most of life.  Saw Grayce Pander at [facility] for 20 years.  Remember taking Ambien , Wellbutrin , Klonopin, trazodone  and Adderall.  No history of suicidal attempt but inpatient at [facility] in December 2024.  Discharged on trazodone , Lexapro , BuSpar  and Sinequan .  H/O break-ups , flashbacks and intrusive thoughts.  Restart trazodone  did not helped. No history of psychosis.  Lunesta  did not covered.  Sonata  and Rexulti  discontinued because of cost.  Strattera  did not work.    Past Medical History:  Diagnosis Date   ADD (attention deficit disorder)    Breast cancer (HCC)    Chest pain    Depression    Diabetes mellitus without complication (HCC)    Dyslipidemia    Kidney stone    Sleep disturbance     Outpatient Encounter Medications as of 03/27/2024  Medication Sig   atomoxetine  (STRATTERA ) 25 MG capsule Take 1 capsule by mouth once daily   Brexpiprazole  (REXULTI ) 0.5 MG TABS Take 1 tablet (0.5 mg total) by mouth daily. (Patient not taking: Reported on 02/14/2024)   buPROPion  (WELLBUTRIN  XL) 300 MG 24 hr tablet Take 1 tablet (300 mg total) by mouth daily.   escitalopram  (LEXAPRO ) 20 MG tablet Take 1 tablet (20 mg total) by mouth daily.   eszopiclone  (LUNESTA ) 2 MG TABS tablet Take 1 tablet (2 mg  total) by mouth at bedtime as needed for sleep. Take immediately before bedtime   ibuprofen (ADVIL) 200 MG tablet Take 400-600 mg by mouth every 6 (six) hours as needed for mild pain (pain score 1-3) or moderate pain (pain score 4-6).   insulin  aspart protamine- aspart (NOVOLOG  MIX 70/30) (70-30) 100 UNIT/ML injection Inject 0.1 mLs (10 Units total) into the skin 2 (two) times daily with a meal.   Insulin  Syringes, Disposable, (B-D INSULIN  SYRINGE 1CC) U-100 1 ML  MISC 1 each by Does not apply route in the morning and at bedtime.   zaleplon  (SONATA ) 5 MG capsule Take 1 capsule (5 mg total) by mouth at bedtime as needed for sleep.   No facility-administered encounter medications on file as of 03/27/2024.    No results found for this or any previous visit (from the past 2160 hours).   Psychiatric Specialty Exam: Physical Exam  Review of Systems  Weight 162 lb (73.5 kg).There is no height or weight on file to calculate BMI.  General Appearance: Casual  Eye Contact:  Good  Speech:  Clear and Coherent  Volume:  Normal  Mood:  Dysphoric and Irritable  Affect:  Depressed  Thought Process:  Descriptions of Associations: Intact  Orientation:  Full (Time, Place, and Person)  Thought Content:  Rumination  Suicidal Thoughts:  No  Homicidal Thoughts:  No  Memory:  Immediate;   Good Recent;   Good Remote;   Fair  Judgement:  Fair  Insight:  Shallow  Psychomotor Activity:  Decreased  Concentration:  Concentration: Fair and Attention Span: Fair  Recall:  Fair  Fund of Knowledge:  Good  Language:  Good  Akathisia:  No  Handed:  Right  AIMS (if indicated):     Assets:  Communication Skills Desire for Improvement Housing Social Support Transportation  ADL's:  Intact  Cognition:  WNL  Sleep:  fair       12/27/2023   11:52 AM 10/11/2023   11:41 AM 09/18/2023    1:27 PM 08/25/2023    9:45 AM  Depression screen PHQ 2/9  Decreased Interest 3 2 1 1   Down, Depressed, Hopeless 3 1 1 2   PHQ - 2 Score 6 3 2 3   Altered sleeping 2 1 1 3   Tired, decreased energy 3 1 1 2   Change in appetite 0 0 1 0  Feeling bad or failure about yourself  2 0 0 1  Trouble concentrating 3 1 0 3  Moving slowly or fidgety/restless 1 1 0 3  Suicidal thoughts 0 0 0 0  PHQ-9 Score 17 7 5 15   Difficult doing work/chores Somewhat difficult Somewhat difficult Somewhat difficult Somewhat difficult    Assessment/Plan: Attention deficit hyperactivity disorder (ADHD), combined  type - Plan: buPROPion  (WELLBUTRIN  XL) 300 MG 24 hr tablet, lisdexamfetamine (VYVANSE ) 20 MG capsule  Moderate episode of recurrent major depressive disorder (HCC) - Plan: escitalopram  (LEXAPRO ) 20 MG tablet, buPROPion  (WELLBUTRIN  XL) 300 MG 24 hr tablet, QUEtiapine  (SEROQUEL ) 50 MG tablet  Generalized anxiety disorder - Plan: escitalopram  (LEXAPRO ) 20 MG tablet, buPROPion  (WELLBUTRIN  XL) 300 MG 24 hr tablet, QUEtiapine  (SEROQUEL ) 50 MG tablet  Insomnia due to other mental disorder - Plan: QUEtiapine  (SEROQUEL ) 50 MG tablet  Patient is 68 year old female with history of type 2 diabetes with recent hemoglobin A1c 11, thyroid  nodules, thrombocytopenia, major depressive disorder, insomnia, generalized anxiety disorder and ADHD.  I had a long discussion with the patient about hospital policy prescribing stimulants.  She had tried Strattera   but did not feel any difference.  She stopped the Rexulti  due to expense.  Now she is asking to go back to Lunesta  because Sonata  is expensive.  In the past she had tried trazodone  which again she reported did not work.  She also took BuSpar , Sinequan , Ambien , Klonopin.  Discussed controlled substance policy.  Recommend to discontinue Sonata  since it is expensive.  Will trial low-dose Seroquel  to help her mood symptoms, insomnia.  Will start 50 mg at bedtime.  I also reviewed blood work results and notes from her endocrinologist.  Discussed possible side effects of Seroquel .  Encouraged to watch her calorie intake.  Encourage walking, exercise.  Patient insist that she wants to try stimulant because it did help her in the past.  Will try low-dose Vyvanse  20 mg however emphasis given that she must do psychological testing to establish diagnosis in order to continue the stimulants.  We will refer her to testing place.  Recommend to call back if you have any question or any concern.  Continue Lexapro  20 mg daily and Wellbutrin  XL 300 mg daily.  Will follow-up in 6  weeks.   Follow Up Instructions:     I discussed the assessment and treatment plan with the patient. The patient was provided an opportunity to ask questions and all were answered. The patient agreed with the plan and demonstrated an understanding of the instructions.   The patient was advised to call back or seek an in-person evaluation if the symptoms worsen or if the condition fails to improve as anticipated.    Collaboration of Care: Other provider involved in patient's care AEB notes are available in epic to review  Patient/Guardian was advised Release of Information must be obtained prior to any record release in order to collaborate their care with an outside provider. Patient/Guardian was advised if they have not already done so to contact the registration department to sign all necessary forms in order for us  to release information regarding their care.   Consent: Patient/Guardian gives verbal consent for treatment and assignment of benefits for services provided during this visit. Patient/Guardian expressed understanding and agreed to proceed.     Total encounter time 32 minutes which includes face-to-face time, chart reviewed, care coordination, order entry and documentation during this encounter.   Note: This document was prepared by [employer] voice dictation technology and any errors that results from this process are unintentional.    Leni ONEIDA Client, MD 03/27/2024

## 2024-05-01 DIAGNOSIS — E1165 Type 2 diabetes mellitus with hyperglycemia: Secondary | ICD-10-CM | POA: Diagnosis not present

## 2024-05-01 DIAGNOSIS — E042 Nontoxic multinodular goiter: Secondary | ICD-10-CM | POA: Diagnosis not present

## 2024-05-01 DIAGNOSIS — Z9641 Presence of insulin pump (external) (internal): Secondary | ICD-10-CM | POA: Diagnosis not present

## 2024-05-01 DIAGNOSIS — G609 Hereditary and idiopathic neuropathy, unspecified: Secondary | ICD-10-CM | POA: Diagnosis not present

## 2024-05-07 DIAGNOSIS — K080 Exfoliation of teeth due to systemic causes: Secondary | ICD-10-CM | POA: Diagnosis not present

## 2024-05-08 ENCOUNTER — Encounter (HOSPITAL_COMMUNITY): Payer: Self-pay | Admitting: Psychiatry

## 2024-05-08 ENCOUNTER — Telehealth (HOSPITAL_BASED_OUTPATIENT_CLINIC_OR_DEPARTMENT_OTHER): Admitting: Psychiatry

## 2024-05-08 VITALS — Wt 164.0 lb

## 2024-05-08 DIAGNOSIS — F411 Generalized anxiety disorder: Secondary | ICD-10-CM

## 2024-05-08 DIAGNOSIS — F902 Attention-deficit hyperactivity disorder, combined type: Secondary | ICD-10-CM | POA: Diagnosis not present

## 2024-05-08 DIAGNOSIS — F331 Major depressive disorder, recurrent, moderate: Secondary | ICD-10-CM

## 2024-05-08 DIAGNOSIS — F5105 Insomnia due to other mental disorder: Secondary | ICD-10-CM

## 2024-05-08 DIAGNOSIS — F99 Mental disorder, not otherwise specified: Secondary | ICD-10-CM

## 2024-05-08 MED ORDER — BUPROPION HCL ER (XL) 300 MG PO TB24: 300.0000 mg | ORAL_TABLET | Freq: Every day | ORAL | 1 refills | Status: DC

## 2024-05-08 MED ORDER — QUETIAPINE FUMARATE 50 MG PO TABS
50.0000 mg | ORAL_TABLET | Freq: Every day | ORAL | 1 refills | Status: DC
Start: 2024-05-08 — End: 2024-06-24

## 2024-05-08 MED ORDER — ESCITALOPRAM OXALATE 20 MG PO TABS: 20.0000 mg | ORAL_TABLET | Freq: Every day | ORAL | 1 refills | Status: DC

## 2024-05-08 MED ORDER — LISDEXAMFETAMINE DIMESYLATE 30 MG PO CAPS: 30.0000 mg | ORAL_CAPSULE | Freq: Every day | ORAL | 0 refills | Status: DC

## 2024-05-08 NOTE — Progress Notes (Signed)
 [facility] MD Virtual Progress Note   Patient Location: Home Provider Location: Home Office  I connect with patient by telephone and verified that I am speaking with correct person by using two identifiers. I discussed the limitations of evaluation and management by telemedicine and the availability of in person appointments. I also discussed with the patient that there may be a patient responsible charge related to this service. The patient expressed understanding and agreed to proceed.  Lacey Perkins [id number] 68 y.o.  05/08/2024 10:50 AM  History of Present Illness:  Patient is evaluated by phone session.  She could not do video because she forgot MyChart password.  She reported Vyvanse  helped for few days and then she is back to same.  Patient told her friend noticed that she is very chatty when she was taking the Vyvanse  in the beginning.  We have recommended ADHD testing and have placed referral for multiple places but so far she has not heard from them.  She still struggle with attention concentration and sometimes lack of energy fatigue and multitasking.  She admitted not having enough motivation to do things.  Recently saw the endocrinologist and started her on Mounjaro.  She noticed since started the medication she had lost few pounds and her blood sugar is much better.  She reported sometimes hopeless and sad but denies any suicidal thoughts or homicidal thoughts.  She is on Lexapro  and Wellbutrin .  She has no tremor or shakes or any EPS.  She denies any worsening of anxiety and her sleep remains fair to okay.  She denies any hallucination, paranoia.  She denies any panic attack.  We started her on Seroquel  and she noticed that helped some of her sleep and depression but like to go up on the Vyvanse  dose.  Past Psychiatric History: H/O ADD, depression, anxiety most of life.  Saw Grayce Pander at [facility] for 20 years.  Remember taking Ambien ,  Wellbutrin , Klonopin, trazodone  and Adderall.  No history of suicidal attempt but inpatient at [facility] in December 2024.  Discharged on trazodone , Lexapro , BuSpar  and Sinequan .  H/O break-ups , flashbacks and intrusive thoughts.  Restart trazodone  did not helped. No history of psychosis.  Lunesta  did not covered.  Sonata  and Rexulti  discontinued because of cost.  Strattera  did not work.   Past Medical History:  Diagnosis Date   ADD (attention deficit disorder)    Breast cancer (HCC)    Chest pain    Depression    Diabetes mellitus without complication (HCC)    Dyslipidemia    Kidney stone    Sleep disturbance     Outpatient Encounter Medications as of 05/08/2024  Medication Sig   atomoxetine  (STRATTERA ) 25 MG capsule Take 1 capsule by mouth once daily   buPROPion  (WELLBUTRIN  XL) 300 MG 24 hr tablet Take 1 tablet (300 mg total) by mouth daily.   escitalopram  (LEXAPRO ) 20 MG tablet Take 1 tablet (20 mg total) by mouth daily.   ibuprofen (ADVIL) 200 MG tablet Take 400-600 mg by mouth every 6 (six) hours as needed for mild pain (pain score 1-3) or moderate pain (pain score 4-6).   insulin  aspart protamine- aspart (NOVOLOG  MIX 70/30) (70-30) 100 UNIT/ML injection Inject 0.1 mLs (10 Units total) into the skin 2 (two) times daily with a meal.   Insulin  Syringes, Disposable, (B-D INSULIN  SYRINGE 1CC) U-100 1 ML MISC 1 each by Does not apply route in the morning and at bedtime.   lisdexamfetamine (VYVANSE ) 20  MG capsule Take 1 capsule (20 mg total) by mouth daily.   QUEtiapine  (SEROQUEL ) 50 MG tablet Take 1 tablet (50 mg total) by mouth at bedtime.   zaleplon  (SONATA ) 5 MG capsule Take 1 capsule (5 mg total) by mouth at bedtime as needed for sleep.   No facility-administered encounter medications on file as of 05/08/2024.    No results found for this or any previous visit (from the past 2160 hours).   Psychiatric Specialty Exam: Physical Exam  Review of Systems  Weight 164 lb  (74.4 kg).There is no height or weight on file to calculate BMI.  General Appearance: NA  Eye Contact:  NA  Speech:  Slow  Volume:  Decreased  Mood:  Dysphoric  Affect:  NA  Thought Process:  Descriptions of Associations: Intact  Orientation:  Full (Time, Place, and Person)  Thought Content:  Rumination  Suicidal Thoughts:  No  Homicidal Thoughts:  No  Memory:  Immediate;   Fair Recent;   Fair Remote;   Good  Judgement:  Fair  Insight:  Shallow  Psychomotor Activity:  NA  Concentration:  Concentration: Fair and Attention Span: Fair  Recall:  Fair  Fund of Knowledge:  Good  Language:  Good  Akathisia:  No  Handed:  Right  AIMS (if indicated):     Assets:  Communication Skills Desire for Improvement Housing Social Support Transportation  ADL's:  Intact  Cognition:  WNL  Sleep:  fair       12/27/2023   11:52 AM 10/11/2023   11:41 AM 09/18/2023    1:27 PM 08/25/2023    9:45 AM  Depression screen PHQ 2/9  Decreased Interest 3 2 1 1   Down, Depressed, Hopeless 3 1 1 2   PHQ - 2 Score 6 3 2 3   Altered sleeping 2 1 1 3   Tired, decreased energy 3 1 1 2   Change in appetite 0 0 1 0  Feeling bad or failure about yourself  2 0 0 1  Trouble concentrating 3 1 0 3  Moving slowly or fidgety/restless 1 1 0 3  Suicidal thoughts 0 0 0 0  PHQ-9 Score 17 7 5 15   Difficult doing work/chores Somewhat difficult Somewhat difficult Somewhat difficult Somewhat difficult    Assessment/Plan: Moderate episode of recurrent major depressive disorder (HCC) - Plan: escitalopram  (LEXAPRO ) 20 MG tablet, buPROPion  (WELLBUTRIN  XL) 300 MG 24 hr tablet, QUEtiapine  (SEROQUEL ) 50 MG tablet  Generalized anxiety disorder - Plan: escitalopram  (LEXAPRO ) 20 MG tablet, buPROPion  (WELLBUTRIN  XL) 300 MG 24 hr tablet, QUEtiapine  (SEROQUEL ) 50 MG tablet  Attention deficit hyperactivity disorder (ADHD), combined type - Plan: buPROPion  (WELLBUTRIN  XL) 300 MG 24 hr tablet, lisdexamfetamine (VYVANSE ) 30 MG  capsule  Insomnia due to other mental disorder - Plan: QUEtiapine  (SEROQUEL ) 50 MG tablet  Patient is 68 year old female with history of diabetes, thyroid  nodules, thrombocytopenia, insomnia, anxiety, ADHD and major depressive disorder.  Discussed current medication.  Patient is now on Mounjaro prescribed by endocrinologist.  Her last hemoglobin A1c was 11.  She is hoping it will help bring down her blood sugar.  She liked the Seroquel  as she noticed more calm and sleep somewhat better.  Will try Vyvanse  30 mg however I emphasized that we will not increase further dose unless and until we have psychological testing for ADHD results.  I emphasized that she need to call back to these places for follow-ups.  I also emphasized that she need to do either in person or video visits.  Continue Seroquel  50 mg at bedtime.  Patient again asked to get Lunesta  however explained cannot provide multiple controlled substances.  Continue Lexapro  20 mg daily and Wellbutrin  XL 300 mg daily.  Recommend to call back if she is any question or any concern.  Follow-up in 6 weeks.   Follow Up Instructions:     I discussed the assessment and treatment plan with the patient. The patient was provided an opportunity to ask questions and all were answered. The patient agreed with the plan and demonstrated an understanding of the instructions.   The patient was advised to call back or seek an in-person evaluation if the symptoms worsen or if the condition fails to improve as anticipated.    Collaboration of Care: Other provider involved in patient's care AEB notes are available in epic to review  Patient/Guardian was advised Release of Information must be obtained prior to any record release in order to collaborate their care with an outside provider. Patient/Guardian was advised if they have not already done so to contact the registration department to sign all necessary forms in order for us  to release information regarding  their care.   Consent: Patient/Guardian gives verbal consent for treatment and assignment of benefits for services provided during this visit. Patient/Guardian expressed understanding and agreed to proceed.     Total encounter time 26 minutes which includes face-to-face time, chart reviewed, care coordination, order entry and documentation during this encounter.   Note: This document was prepared by [employer] voice dictation technology and any errors that results from this process are unintentional.    Leni ONEIDA Client, MD 05/08/2024

## 2024-05-10 ENCOUNTER — Encounter (HOSPITAL_COMMUNITY): Payer: Self-pay | Admitting: *Deleted

## 2024-06-20 ENCOUNTER — Telehealth (HOSPITAL_COMMUNITY): Admitting: Psychiatry

## 2024-06-21 ENCOUNTER — Other Ambulatory Visit (HOSPITAL_COMMUNITY): Payer: Self-pay | Admitting: Psychiatry

## 2024-06-21 DIAGNOSIS — F331 Major depressive disorder, recurrent, moderate: Secondary | ICD-10-CM

## 2024-06-21 DIAGNOSIS — Z9641 Presence of insulin pump (external) (internal): Secondary | ICD-10-CM | POA: Diagnosis not present

## 2024-06-21 DIAGNOSIS — F5105 Insomnia due to other mental disorder: Secondary | ICD-10-CM

## 2024-06-21 DIAGNOSIS — E1165 Type 2 diabetes mellitus with hyperglycemia: Secondary | ICD-10-CM | POA: Diagnosis not present

## 2024-06-21 DIAGNOSIS — F411 Generalized anxiety disorder: Secondary | ICD-10-CM

## 2024-06-21 DIAGNOSIS — E042 Nontoxic multinodular goiter: Secondary | ICD-10-CM | POA: Diagnosis not present

## 2024-06-21 DIAGNOSIS — G609 Hereditary and idiopathic neuropathy, unspecified: Secondary | ICD-10-CM | POA: Diagnosis not present

## 2024-06-24 ENCOUNTER — Encounter (HOSPITAL_COMMUNITY): Payer: Self-pay | Admitting: Psychiatry

## 2024-06-24 ENCOUNTER — Telehealth (HOSPITAL_COMMUNITY): Admitting: Psychiatry

## 2024-06-24 VITALS — Wt 164.0 lb

## 2024-06-24 DIAGNOSIS — F411 Generalized anxiety disorder: Secondary | ICD-10-CM | POA: Diagnosis not present

## 2024-06-24 DIAGNOSIS — F5105 Insomnia due to other mental disorder: Secondary | ICD-10-CM | POA: Diagnosis not present

## 2024-06-24 DIAGNOSIS — F331 Major depressive disorder, recurrent, moderate: Secondary | ICD-10-CM

## 2024-06-24 DIAGNOSIS — F902 Attention-deficit hyperactivity disorder, combined type: Secondary | ICD-10-CM

## 2024-06-24 DIAGNOSIS — F99 Mental disorder, not otherwise specified: Secondary | ICD-10-CM

## 2024-06-24 MED ORDER — LISDEXAMFETAMINE DIMESYLATE 30 MG PO CAPS: 30.0000 mg | ORAL_CAPSULE | Freq: Every day | ORAL | 0 refills | Status: AC

## 2024-06-24 MED ORDER — QUETIAPINE FUMARATE 100 MG PO TABS: 100.0000 mg | ORAL_TABLET | Freq: Every day | ORAL | 2 refills | Status: AC

## 2024-06-24 MED ORDER — BUPROPION HCL ER (XL) 300 MG PO TB24: 300.0000 mg | ORAL_TABLET | Freq: Every day | ORAL | 2 refills | Status: AC

## 2024-06-24 MED ORDER — ESCITALOPRAM OXALATE 20 MG PO TABS: 20.0000 mg | ORAL_TABLET | Freq: Every day | ORAL | 2 refills | Status: AC

## 2024-06-24 NOTE — Progress Notes (Signed)
 [facility] MD Virtual Progress Note   Patient Location: Home Provider Location: Home office  I connect with patient by video and verified that I am speaking with correct person by using two identifiers. I discussed the limitations of evaluation and management by telemedicine and the availability of in person appointments. I also discussed with the patient that there may be a patient responsible charge related to this service. The patient expressed understanding and agreed to proceed.  Lacey Perkins [id number] 68 y.o.  06/24/2024 11:49 AM  History of Present Illness:  Patient is evaluated by video session.  She continues to struggle with lack of motivation and desire to do things.  However recently seen her endocrinologist Dr. Marchia and happy that her hemoglobin A1c dropped from 9.1-7.1.  She is on Mounjaro.  She did not lost a lot of weight but her sugar is much better.  We discussed about ADHD testing and patient has not had done any testing.  We had provided Vyvanse  30 mg patient did not see a huge difference and not sure if she like to continue in the future.  She is taking Seroquel  50 mg at bedtime and not sleeping very well.  She is also taking Wellbutrin  and Lexapro .  She has no tremor or shakes or any EPS.  She denies any hopelessness, worthlessness, suicidal thoughts.  Her biggest challenge is lack of motivation and desire to do things.  She also gets anxious and nervous when she go outside.  Last week she had a early Thanksgiving dinner as her son who lives in [place] came and niece and nephew who lives in [place] also came.  She has a 1 son who is nearby.  Her son who lives in [place]  did not able to come.  Patient denies any irritability, anger, frustration.  She denies any active or passive suicidal thoughts or homicidal thoughts.  She feels her depression is at plateau and her anxiety is not much change.  It is not getting better but also it is not getting  worse.  Past Psychiatric History: H/O ADD, depression, anxiety most of life.  Saw Grayce Pander at [place] [facility] for 20 years.  Remember taking Ambien , Wellbutrin , Klonopin, trazodone  and Adderall.  No history of suicidal attempt but inpatient at [facility] in December 2024.  Discharged on trazodone , Lexapro , BuSpar  and Sinequan .  H/O break-ups , flashbacks and intrusive thoughts.  Restart trazodone  did not helped. No history of psychosis.  Lunesta  did not covered.  Sonata  and Rexulti  discontinued because of cost.  Strattera  did not work.   Past Medical History:  Diagnosis Date   ADD (attention deficit disorder)    Breast cancer (HCC)    Chest pain    Depression    Diabetes mellitus without complication (HCC)    Dyslipidemia    Kidney stone    Sleep disturbance     Outpatient Encounter Medications as of 06/24/2024  Medication Sig   buPROPion  (WELLBUTRIN  XL) 300 MG 24 hr tablet Take 1 tablet (300 mg total) by mouth daily.   Continuous Glucose Sensor (DEXCOM G7 SENSOR) MISC SMARTSIG:1 Every Other Day   escitalopram  (LEXAPRO ) 20 MG tablet Take 1 tablet (20 mg total) by mouth daily.   ibuprofen (ADVIL) 200 MG tablet Take 400-600 mg by mouth every 6 (six) hours as needed for mild pain (pain score 1-3) or moderate pain (pain score 4-6).   insulin  aspart protamine- aspart (NOVOLOG  MIX 70/30) (70-30) 100 UNIT/ML injection Inject 0.1 mLs (10  Units total) into the skin 2 (two) times daily with a meal.   Insulin  Syringes, Disposable, (B-D INSULIN  SYRINGE 1CC) U-100 1 ML MISC 1 each by Does not apply route in the morning and at bedtime.   lisdexamfetamine (VYVANSE ) 30 MG capsule Take 1 capsule (30 mg total) by mouth daily.   MOUNJARO 2.5 MG/0.5ML Pen Inject 2.5 mg into the skin once a week.   QUEtiapine  (SEROQUEL ) 50 MG tablet Take 1 tablet (50 mg total) by mouth at bedtime.   No facility-administered encounter medications on file as of 06/24/2024.    No results found for  this or any previous visit (from the past 2160 hours).   Psychiatric Specialty Exam: Physical Exam  Review of Systems  Weight 164 lb (74.4 kg).There is no height or weight on file to calculate BMI.  General Appearance: Casual  Eye Contact:  Fair  Speech:  Slow  Volume:  Decreased  Mood:  Euthymic  Affect:  Appropriate  Thought Process:  Goal Directed  Orientation:  Full (Time, Place, and Person)  Thought Content:  Rumination  Suicidal Thoughts:  No  Homicidal Thoughts:  No  Memory:  Immediate;   Good Recent;   Good Remote;   Good  Judgement:  Fair  Insight:  Present  Psychomotor Activity:  Decreased  Concentration:  Concentration: Good and Attention Span: Good  Recall:  Good  Fund of Knowledge:  Good  Language:  Good  Akathisia:  No  Handed:  Right  AIMS (if indicated):     Assets:  Communication Skills Desire for Improvement Housing Transportation  ADL's:  Intact  Cognition:  WNL  Sleep:  fair       12/27/2023   11:52 AM 10/11/2023   11:41 AM 09/18/2023    1:27 PM 08/25/2023    9:45 AM  Depression screen PHQ 2/9  Decreased Interest 3 2 1 1   Down, Depressed, Hopeless 3 1 1 2   PHQ - 2 Score 6 3 2 3   Altered sleeping 2 1 1 3   Tired, decreased energy 3 1 1 2   Change in appetite 0 0 1 0  Feeling bad or failure about yourself  2 0 0 1  Trouble concentrating 3 1 0 3  Moving slowly or fidgety/restless 1 1 0 3  Suicidal thoughts 0 0 0 0  PHQ-9 Score 17  7  5  15    Difficult doing work/chores Somewhat difficult Somewhat difficult Somewhat difficult Somewhat difficult     Data saved with a previous flowsheet row definition    Assessment/Plan: Moderate episode of recurrent major depressive disorder (HCC) - Plan: escitalopram  (LEXAPRO ) 20 MG tablet, QUEtiapine  (SEROQUEL ) 100 MG tablet, buPROPion  (WELLBUTRIN  XL) 300 MG 24 hr tablet  Generalized anxiety disorder - Plan: escitalopram  (LEXAPRO ) 20 MG tablet, QUEtiapine  (SEROQUEL ) 100 MG tablet, buPROPion  (WELLBUTRIN  XL)  300 MG 24 hr tablet  Attention deficit hyperactivity disorder (ADHD), combined type - Plan: lisdexamfetamine (VYVANSE ) 30 MG capsule, buPROPion  (WELLBUTRIN  XL) 300 MG 24 hr tablet  Insomnia due to other mental disorder - Plan: QUEtiapine  (SEROQUEL ) 100 MG tablet  Patient is 68 year old female with history of diabetes, thyroid  nodules, thrombocytopenia, anxiety, ADHD and major depressive disorder.  Had not seen significant improvement with Vyvanse  but is still wants to keep it and hoping you to give more time to work on.  She started sleeping issues and not sleeping very well.  She like to go on Seroquel .  I talked to other options like Lamictal but due to allergic  reaction with the sulfa in the past she is reluctant to try Lamictal.  She is okay to try higher dose of Seroquel  as recently her blood sugar is much better and drop her hemoglobin A1c her depression is chronic and is stable and stable on Wellbutrin .  Discussed to consider trying newer antidepressant as Trintellix and Viibryd but patient like to wait and like to keep the Lexapro  20 mg daily, Wellbutrin  XL 300 mg daily, Vyvanse  30 mg daily and try Seroquel  1 mg at bedtime.  So far no tremors shakes or any EPS.  Will follow-up in 3 months unless patient requested an earlier appointment.   Follow Up Instructions:     I discussed the assessment and treatment plan with the patient. The patient was provided an opportunity to ask questions and all were answered. The patient agreed with the plan and demonstrated an understanding of the instructions.   The patient was advised to call back or seek an in-person evaluation if the symptoms worsen or if the condition fails to improve as anticipated.    Collaboration of Care: Other provider involved in patient's care AEB notes are available in epic to review  Patient/Guardian was advised Release of Information must be obtained prior to any record release in order to collaborate their care with an  outside provider. Patient/Guardian was advised if they have not already done so to contact the registration department to sign all necessary forms in order for us  to release information regarding their care.   Consent: Patient/Guardian gives verbal consent for treatment and assignment of benefits for services provided during this visit. Patient/Guardian expressed understanding and agreed to proceed.     Total encounter time 26 minutes which includes face-to-face time, chart reviewed, care coordination, order entry and documentation during this encounter.   Note: This document was prepared by [employer] voice dictation technology and any errors that results from this process are unintentional.    Leni ONEIDA Client, MD 06/24/2024

## 2024-07-08 ENCOUNTER — Other Ambulatory Visit (HOSPITAL_COMMUNITY): Payer: Self-pay | Admitting: Psychiatry

## 2024-07-08 DIAGNOSIS — F5105 Insomnia due to other mental disorder: Secondary | ICD-10-CM

## 2024-07-08 DIAGNOSIS — F411 Generalized anxiety disorder: Secondary | ICD-10-CM

## 2024-07-08 DIAGNOSIS — F331 Major depressive disorder, recurrent, moderate: Secondary | ICD-10-CM

## 2024-09-15 ENCOUNTER — Other Ambulatory Visit (HOSPITAL_COMMUNITY): Payer: Self-pay | Admitting: Psychiatry

## 2024-09-15 DIAGNOSIS — F331 Major depressive disorder, recurrent, moderate: Secondary | ICD-10-CM

## 2024-09-15 DIAGNOSIS — F5105 Insomnia due to other mental disorder: Secondary | ICD-10-CM

## 2024-09-15 DIAGNOSIS — F411 Generalized anxiety disorder: Secondary | ICD-10-CM

## 2024-09-20 ENCOUNTER — Encounter: Payer: Self-pay | Admitting: *Deleted

## 2024-09-20 NOTE — Progress Notes (Signed)
 Lacey Perkins                                          MRN: [id number]   09/20/2024   The [facility] Quality Team Specialist reviewed this patient medical record for the purposes of chart review for care gap closure. The following were reviewed: chart review for care gap closure-glycemic status assessment.    [facility] Quality Team

## 2024-09-24 ENCOUNTER — Telehealth (HOSPITAL_COMMUNITY): Admitting: Psychiatry

## 2024-12-03 ENCOUNTER — Inpatient Hospital Stay (HOSPITAL_COMMUNITY): Admit: 2024-12-03
